# Patient Record
Sex: Female | Born: 1965 | Race: Black or African American | Hispanic: No | Marital: Single
Health system: Southern US, Community
[De-identification: ages and names within clinical notes are randomized; demographics above are authoritative.]

## PROBLEM LIST (undated history)

## (undated) DIAGNOSIS — J189 Pneumonia, unspecified organism: Secondary | ICD-10-CM

## (undated) DIAGNOSIS — G4733 Obstructive sleep apnea (adult) (pediatric): Secondary | ICD-10-CM

## (undated) DIAGNOSIS — I469 Cardiac arrest, cause unspecified: Secondary | ICD-10-CM

## (undated) DIAGNOSIS — I482 Chronic atrial fibrillation, unspecified: Secondary | ICD-10-CM

## (undated) DIAGNOSIS — I5022 Chronic systolic (congestive) heart failure: Secondary | ICD-10-CM

## (undated) DIAGNOSIS — A419 Sepsis, unspecified organism: Secondary | ICD-10-CM

## (undated) DIAGNOSIS — N170 Acute kidney failure with tubular necrosis: Secondary | ICD-10-CM

## (undated) DIAGNOSIS — J9621 Acute and chronic respiratory failure with hypoxia: Secondary | ICD-10-CM

## (undated) DIAGNOSIS — R6520 Severe sepsis without septic shock: Secondary | ICD-10-CM

---

## 2018-07-04 ENCOUNTER — Inpatient Hospital Stay
Admission: AD | Admit: 2018-07-04 | Discharge: 2018-08-01 | Disposition: A | Discharge status: Skilled Nursing Facility | Payer: Medicaid Other | Source: Other Acute Inpatient Hospital | Attending: Internal Medicine | Admitting: Internal Medicine

## 2018-07-04 ENCOUNTER — Other Ambulatory Visit (HOSPITAL_COMMUNITY): Payer: Medicaid Other

## 2018-07-04 DIAGNOSIS — I469 Cardiac arrest, cause unspecified: Secondary | ICD-10-CM | POA: Diagnosis present

## 2018-07-04 DIAGNOSIS — J189 Pneumonia, unspecified organism: Secondary | ICD-10-CM

## 2018-07-04 DIAGNOSIS — J9690 Respiratory failure, unspecified, unspecified whether with hypoxia or hypercapnia: Secondary | ICD-10-CM

## 2018-07-04 DIAGNOSIS — I482 Chronic atrial fibrillation, unspecified: Secondary | ICD-10-CM | POA: Diagnosis present

## 2018-07-04 DIAGNOSIS — N170 Acute kidney failure with tubular necrosis: Secondary | ICD-10-CM | POA: Diagnosis present

## 2018-07-04 DIAGNOSIS — T790XXA Air embolism (traumatic), initial encounter: Secondary | ICD-10-CM

## 2018-07-04 DIAGNOSIS — J811 Chronic pulmonary edema: Secondary | ICD-10-CM

## 2018-07-04 DIAGNOSIS — J9621 Acute and chronic respiratory failure with hypoxia: Secondary | ICD-10-CM | POA: Diagnosis present

## 2018-07-04 DIAGNOSIS — Z9911 Dependence on respirator [ventilator] status: Secondary | ICD-10-CM

## 2018-07-04 DIAGNOSIS — I82409 Acute embolism and thrombosis of unspecified deep veins of unspecified lower extremity: Secondary | ICD-10-CM

## 2018-07-04 DIAGNOSIS — G4733 Obstructive sleep apnea (adult) (pediatric): Secondary | ICD-10-CM | POA: Diagnosis present

## 2018-07-04 HISTORY — DX: Cardiac arrest, cause unspecified: I46.9

## 2018-07-04 HISTORY — DX: Acute kidney failure with tubular necrosis: N17.0

## 2018-07-04 HISTORY — DX: Chronic atrial fibrillation, unspecified: I48.20

## 2018-07-04 HISTORY — DX: Acute and chronic respiratory failure with hypoxia: J96.21

## 2018-07-04 HISTORY — DX: Obstructive sleep apnea (adult) (pediatric): G47.33

## 2018-07-04 LAB — BLOOD GAS, ARTERIAL
Acid-Base Excess: 1.8 mmol/L (ref 0.0–2.0)
Bicarbonate: 26.1 mmol/L (ref 20.0–28.0)
FIO2: 50
MECHVT: 500 mL
O2 Saturation: 96.6 %
PEEP: 5 cmH2O
Patient temperature: 98.6
RATE: 16 resp/min
pCO2 arterial: 42.6 mmHg (ref 32.0–48.0)
pH, Arterial: 7.404 (ref 7.350–7.450)
pO2, Arterial: 88.4 mmHg (ref 83.0–108.0)

## 2018-07-05 ENCOUNTER — Encounter: Payer: Self-pay | Admitting: Internal Medicine

## 2018-07-05 ENCOUNTER — Ambulatory Visit (HOSPITAL_COMMUNITY): Payer: Medicare Other

## 2018-07-05 DIAGNOSIS — N170 Acute kidney failure with tubular necrosis: Secondary | ICD-10-CM | POA: Diagnosis present

## 2018-07-05 DIAGNOSIS — J9621 Acute and chronic respiratory failure with hypoxia: Secondary | ICD-10-CM | POA: Diagnosis present

## 2018-07-05 DIAGNOSIS — I469 Cardiac arrest, cause unspecified: Secondary | ICD-10-CM | POA: Diagnosis present

## 2018-07-05 DIAGNOSIS — I482 Chronic atrial fibrillation, unspecified: Secondary | ICD-10-CM | POA: Diagnosis not present

## 2018-07-05 DIAGNOSIS — G4733 Obstructive sleep apnea (adult) (pediatric): Secondary | ICD-10-CM | POA: Diagnosis present

## 2018-07-05 LAB — CBC WITH DIFFERENTIAL/PLATELET
Abs Immature Granulocytes: 0.8 10*3/uL — ABNORMAL HIGH (ref 0.00–0.07)
Basophils Absolute: 0 10*3/uL (ref 0.0–0.1)
Basophils Relative: 0 %
Eosinophils Absolute: 0 10*3/uL (ref 0.0–0.5)
Eosinophils Relative: 0 %
HCT: 31.3 % — ABNORMAL LOW (ref 36.0–46.0)
Hemoglobin: 9.4 g/dL — ABNORMAL LOW (ref 12.0–15.0)
Lymphocytes Relative: 6 %
Lymphs Abs: 1.2 10*3/uL (ref 0.7–4.0)
MCH: 28.6 pg (ref 26.0–34.0)
MCHC: 30 g/dL (ref 30.0–36.0)
MCV: 95.1 fL (ref 80.0–100.0)
Metamyelocytes Relative: 3 %
Monocytes Absolute: 0.6 10*3/uL (ref 0.1–1.0)
Monocytes Relative: 3 %
Myelocytes: 1 %
Neutro Abs: 18 10*3/uL — ABNORMAL HIGH (ref 1.7–7.7)
Neutrophils Relative %: 87 %
Platelets: 349 10*3/uL (ref 150–400)
RBC: 3.29 MIL/uL — ABNORMAL LOW (ref 3.87–5.11)
RDW: 16.7 % — ABNORMAL HIGH (ref 11.5–15.5)
WBC: 20.7 10*3/uL — ABNORMAL HIGH (ref 4.0–10.5)
nRBC: 1.5 % — ABNORMAL HIGH (ref 0.0–0.2)
nRBC: 2 /100 WBC — ABNORMAL HIGH

## 2018-07-05 LAB — COMPREHENSIVE METABOLIC PANEL
ALT: 34 U/L (ref 0–44)
AST: 24 U/L (ref 15–41)
Albumin: 2.3 g/dL — ABNORMAL LOW (ref 3.5–5.0)
Alkaline Phosphatase: 46 U/L (ref 38–126)
Anion gap: 8 (ref 5–15)
BUN: 85 mg/dL — ABNORMAL HIGH (ref 6–20)
CO2: 24 mmol/L (ref 22–32)
Calcium: 8.4 mg/dL — ABNORMAL LOW (ref 8.9–10.3)
Chloride: 120 mmol/L — ABNORMAL HIGH (ref 98–111)
Creatinine, Ser: 1.55 mg/dL — ABNORMAL HIGH (ref 0.44–1.00)
GFR calc Af Amer: 44 mL/min — ABNORMAL LOW (ref >60)
GFR calc non Af Amer: 38 mL/min — ABNORMAL LOW (ref >60)
Glucose, Bld: 304 mg/dL — ABNORMAL HIGH (ref 70–99)
Potassium: 5.7 mmol/L — ABNORMAL HIGH (ref 3.5–5.1)
Sodium: 152 mmol/L — ABNORMAL HIGH (ref 135–145)
Total Bilirubin: 0.8 mg/dL (ref 0.3–1.2)
Total Protein: 7 g/dL (ref 6.5–8.1)

## 2018-07-05 LAB — URINALYSIS, ROUTINE W REFLEX MICROSCOPIC
Bilirubin Urine: NEGATIVE
Glucose, UA: NEGATIVE mg/dL
Hgb urine dipstick: NEGATIVE
Ketones, ur: NEGATIVE mg/dL
Leukocytes, UA: NEGATIVE
Nitrite: NEGATIVE
Protein, ur: NEGATIVE mg/dL
Specific Gravity, Urine: 1.019 (ref 1.005–1.030)
pH: 5 (ref 5.0–8.0)

## 2018-07-05 LAB — PROTIME-INR
INR: 1.3
Prothrombin Time: 16 seconds — ABNORMAL HIGH (ref 11.4–15.2)

## 2018-07-05 LAB — HEMOGLOBIN A1C
Hgb A1c MFr Bld: 8.7 % — ABNORMAL HIGH (ref 4.8–5.6)
Mean Plasma Glucose: 202.99 mg/dL

## 2018-07-05 LAB — TSH: TSH: 0.382 u[IU]/mL (ref 0.350–4.500)

## 2018-07-05 LAB — PHOSPHORUS: Phosphorus: 5.2 mg/dL — ABNORMAL HIGH (ref 2.5–4.6)

## 2018-07-05 LAB — MAGNESIUM: Magnesium: 2.2 mg/dL (ref 1.7–2.4)

## 2018-07-05 NOTE — Consult Note (Signed)
Pulmonary Critical Care [facility]  Date of Service: 07/05/2018  PULMONARY CRITICAL CARE CONSULT   Cristina Norris  ZOX:[id number]  DOB: 17-Oct-1965   DOA: 07/04/2018  Referring Physician: Carron CurieAli Hijazi, MD  HPI: Cristina Norris is a 53 y.o. female seen for follow up of Acute on Chronic Respiratory Failure.  Patient has multiple medical problems including pneumonia chronic atrial fibrillation V. fib arrest encephalopathy anoxic brain injury acute kidney injury sleep apnea congestive heart failure coronary artery disease.  Patient presented to the hospital after a V. fib arrest.  The patient was shocked with return of circulation and patient subsequently was found to be in atrial fibrillation.  She was intubated at the time mostly for airway protection.  Right now she does open the eyes.  Subsequently patient had complications underwent a tracheostomy on January 15 because of failure to wean off the ventilator.  Patient was evaluated by cardiology for the atrial fibrillation started on amiodarone.  Patient also was found to have pneumonia most which turned out to be Staphylococcus.  Patient was transferred to our facility for further management and weaning.  She was she did not have a cardiac catheterization done at the transferring facility.  Review of Systems:  ROS performed and is unremarkable other than noted above.  Past medical history: Chronic anemia Arthritis CHF Coronary artery disease Depression Sleep apnea Morbid obesity Type 2 diabetes Hypertension Hyperlipidemia Cardiac arrest Atrial fibrillation Acute renal failure Anoxic brain injury  Past surgical history  tracheostomy Arm surgery DNC Knee surgery Tonsillectomy Tubal ligation  Allergies: No known drug allergies  Family history: Diabetes Heart disease Hypertension   Medications: Reviewed on Rounds  Physical Exam:  Vitals: Temperature 98.8 pulse 87  respiratory rate 22 blood pressure 151/87 saturations 92%  Ventilator Settings currently is on assist control FiO2 50% tidal volume 601 PEEP 5  . General: Comfortable at this time . Eyes: Grossly normal lids, irises & conjunctiva . ENT: grossly tongue is normal . Neck: no obvious mass . Cardiovascular: S1-S2 normal no gallop or rub . Respiratory: No rhonchi no rales . Abdomen: Soft nontender . Skin: no rash seen on limited exam . Musculoskeletal: not rigid . Psychiatric:unable to assess . Neurologic: no seizure no involuntary movements         Labs on Admission:  Basic Metabolic Panel: Recent Labs  Lab 07/05/18 0555  NA 152*  K 5.7*  CL 120*  CO2 24  GLUCOSE 304*  BUN 85*  CREATININE 1.55*  CALCIUM 8.4*  MG 2.2  PHOS 5.2*    Recent Labs  Lab 07/04/18 1905  PHART 7.404  PCO2ART 42.6  PO2ART 88.4  HCO3 26.1  O2SAT 96.6    Liver Function Tests: Recent Labs  Lab 07/05/18 0555  AST 24  ALT 34  ALKPHOS 46  BILITOT 0.8  PROT 7.0  ALBUMIN 2.3*   No results for input(s): LIPASE, AMYLASE in the last 168 hours. No results for input(s): AMMONIA in the last 168 hours.  CBC: Recent Labs  Lab 07/05/18 0555  WBC 20.7*  NEUTROABS 18.0*  HGB 9.4*  HCT 31.3*  MCV 95.1  PLT 349    Cardiac Enzymes: No results for input(s): CKTOTAL, CKMB, CKMBINDEX, TROPONINI in the last 168 hours.  BNP (last 3 results) No results for input(s): BNP in the last 8760 hours.  ProBNP (last 3 results) No results for input(s): PROBNP in the last 8760 hours.   Radiological Exams on Admission: Dg  Abd 1 View  Result Date: 07/04/2018 CLINICAL DATA:  Check gastric catheter placement EXAM: ABDOMEN - 1 VIEW COMPARISON:  None. FINDINGS: Gastric catheter is noted within the distal stomach. Scattered large and small bowel gas is noted. No acute abnormality seen. IMPRESSION: Gastric catheter within the distal stomach. Electronically Signed   By: Alcide Clever M.D.   On: 07/04/2018 18:51    Dg Chest Port 1 View  Result Date: 07/04/2018 CLINICAL DATA:  Respiratory failure EXAM: PORTABLE CHEST 1 VIEW COMPARISON:  None. FINDINGS: Tracheostomy tube and gastric catheter are noted in satisfactory position. Heart is mildly enlarged in size but accentuated by the portable technique. Patient rotation to the right is noted. Some accentuation of the mediastinal markings is noted although diffuse central vascular prominence is noted as well as right perihilar and infrahilar infiltrate. No bony abnormality is noted. IMPRESSION: Prominent vasculature which may represent some early CHF. Right hilar and perihilar infiltrative density. Electronically Signed   By: Alcide Clever M.D.   On: 07/04/2018 18:50    Assessment/Plan Active Problems:   Acute on chronic respiratory failure with hypoxia (HCC)   Chronic atrial fibrillation with rapid ventricular response   Acute tubular necrosis (HCC)   Cardiac arrest (HCC)   Obstructive sleep apnea   1. Acute on chronic respiratory failure with hypoxia at this time patient is on full vent support.  Patient was assessed within our SBI having been done today this was not feasible to start the wean.  Patient is still requiring 50% FiO2.  We will try to wean her oxygen down as tolerated and reassess the RSB I daily.  Follow-up x-rays as needed 2. Chronic atrial fibrillation right now she is still in rapid A. fib she has been on amiodarone and also on beta blockers.  We will need to adjust her medications accordingly. 3. Acute renal failure secondary to tubular necrosis BUN 85 creatinine 1.55 at this time.  Will need to monitor closely monitor her fluid status also 4. Cardiac arrest she has a stable atrial fibrillation but currently is in rapid ventricular response need to get this under control. 5. Obstructive sleep apnea nonissue at this time patient is on the ventilator  I have personally seen and evaluated the patient, evaluated laboratory and imaging results,  formulated the assessment and plan and placed orders. The Patient requires high complexity decision making for assessment and support.  Case was discussed on Rounds with the Respiratory Therapy Staff Time Spent  Yevonne Pax, MD [facility] Pulmonary Critical Care Medicine Sleep Medicine

## 2018-07-06 ENCOUNTER — Encounter (HOSPITAL_BASED_OUTPATIENT_CLINIC_OR_DEPARTMENT_OTHER): Payer: Medicaid Other

## 2018-07-06 ENCOUNTER — Encounter (HOSPITAL_COMMUNITY): Payer: Medicaid Other

## 2018-07-06 DIAGNOSIS — M7989 Other specified soft tissue disorders: Secondary | ICD-10-CM

## 2018-07-06 DIAGNOSIS — N170 Acute kidney failure with tubular necrosis: Secondary | ICD-10-CM | POA: Diagnosis not present

## 2018-07-06 DIAGNOSIS — J9621 Acute and chronic respiratory failure with hypoxia: Secondary | ICD-10-CM | POA: Diagnosis not present

## 2018-07-06 DIAGNOSIS — I482 Chronic atrial fibrillation, unspecified: Secondary | ICD-10-CM | POA: Diagnosis not present

## 2018-07-06 DIAGNOSIS — I469 Cardiac arrest, cause unspecified: Secondary | ICD-10-CM | POA: Diagnosis not present

## 2018-07-06 LAB — CBC
HCT: 29.8 % — ABNORMAL LOW (ref 36.0–46.0)
Hemoglobin: 8.7 g/dL — ABNORMAL LOW (ref 12.0–15.0)
MCH: 27.9 pg (ref 26.0–34.0)
MCHC: 29.2 g/dL — ABNORMAL LOW (ref 30.0–36.0)
MCV: 95.5 fL (ref 80.0–100.0)
Platelets: 316 10*3/uL (ref 150–400)
RBC: 3.12 MIL/uL — ABNORMAL LOW (ref 3.87–5.11)
RDW: 16.4 % — ABNORMAL HIGH (ref 11.5–15.5)
WBC: 18.4 10*3/uL — ABNORMAL HIGH (ref 4.0–10.5)
nRBC: 1.4 % — ABNORMAL HIGH (ref 0.0–0.2)

## 2018-07-06 LAB — CULTURE, RESPIRATORY W GRAM STAIN: Culture: NORMAL

## 2018-07-06 LAB — RENAL FUNCTION PANEL
Albumin: 2 g/dL — ABNORMAL LOW (ref 3.5–5.0)
Anion gap: 7 (ref 5–15)
BUN: 75 mg/dL — ABNORMAL HIGH (ref 6–20)
CO2: 25 mmol/L (ref 22–32)
Calcium: 8 mg/dL — ABNORMAL LOW (ref 8.9–10.3)
Chloride: 119 mmol/L — ABNORMAL HIGH (ref 98–111)
Creatinine, Ser: 1.41 mg/dL — ABNORMAL HIGH (ref 0.44–1.00)
GFR calc Af Amer: 49 mL/min — ABNORMAL LOW (ref >60)
GFR calc non Af Amer: 42 mL/min — ABNORMAL LOW (ref >60)
Glucose, Bld: 309 mg/dL — ABNORMAL HIGH (ref 70–99)
Phosphorus: 4.6 mg/dL (ref 2.5–4.6)
Potassium: 4.9 mmol/L (ref 3.5–5.1)
Sodium: 151 mmol/L — ABNORMAL HIGH (ref 135–145)

## 2018-07-06 LAB — URINE CULTURE: Culture: NO GROWTH

## 2018-07-06 LAB — MAGNESIUM: Magnesium: 2.1 mg/dL (ref 1.7–2.4)

## 2018-07-06 LAB — BRAIN NATRIURETIC PEPTIDE: B Natriuretic Peptide: 1109.4 pg/mL — ABNORMAL HIGH (ref 0.0–100.0)

## 2018-07-06 NOTE — Consult Note (Signed)
Ref: Default, Provider, MD   Subjective:  Atrial fibillation with RVR at times.   Objective:  Vital Signs in the last 24 hours: BP: ()/()  Arterial Line BP: ()/()   Physical Exam: BP Readings from Last 1 Encounters:  No data found for BP     Wt Readings from Last 1 Encounters:  No data found for Wt    Weight change:  There is no height or weight on file to calculate BMI. HEENT: [place]/AT, Eyes-Brown, Conjunctiva-Pale, Sclera-Non-icteric.  Neck: No JVD, No bruit, Trachea midline. Tracheostomy tube in place. Lungs:  Clearing, Bilateral. Cardiac:  Irregular rhythm, normal S1 and S2, no S3. II/VI systolic murmur. Abdomen:  Soft, non-tender. BS present. Extremities:  1 + edema present. No cyanosis. No clubbing. CNS: AxOx1, Cranial nerves grossly intact.  Skin: Warm and dry.   Intake/Output from previous day: No intake/output data recorded.    Lab Results: BMET    Component Value Date/Time   NA 151 (H) 07/06/2018 0628   NA 152 (H) 07/05/2018 0555   K 4.9 07/06/2018 0628   K 5.7 (H) 07/05/2018 0555   CL 119 (H) 07/06/2018 0628   CL 120 (H) 07/05/2018 0555   CO2 25 07/06/2018 0628   CO2 24 07/05/2018 0555   GLUCOSE 309 (H) 07/06/2018 0628   GLUCOSE 304 (H) 07/05/2018 0555   BUN 75 (H) 07/06/2018 0628   BUN 85 (H) 07/05/2018 0555   CREATININE 1.41 (H) 07/06/2018 0628   CREATININE 1.55 (H) 07/05/2018 0555   CALCIUM 8.0 (L) 07/06/2018 0628   CALCIUM 8.4 (L) 07/05/2018 0555   GFRNONAA 42 (L) 07/06/2018 0628   GFRNONAA 38 (L) 07/05/2018 0555   GFRAA 49 (L) 07/06/2018 0628   GFRAA 44 (L) 07/05/2018 0555   CBC    Component Value Date/Time   WBC 18.4 (H) 07/06/2018 0628   RBC 3.12 (L) 07/06/2018 0628   HGB 8.7 (L) 07/06/2018 0628   HCT 29.8 (L) 07/06/2018 0628   PLT 316 07/06/2018 0628   MCV 95.5 07/06/2018 0628   MCH 27.9 07/06/2018 0628   MCHC 29.2 (L) 07/06/2018 0628   RDW 16.4 (H) 07/06/2018 0628   LYMPHSABS 1.2 07/05/2018 0555   MONOABS 0.6 07/05/2018 0555   EOSABS 0.0 07/05/2018 0555   BASOSABS 0.0 07/05/2018 0555   HEPATIC Function Panel Recent Labs    07/05/18 0555  PROT 7.0   HEMOGLOBIN A1C No components found for: HGA1C,  MPG CARDIAC ENZYMES No results found for: CKTOTAL, CKMB, CKMBINDEX, TROPONINI BNP No results for input(s): PROBNP in the last 8760 hours. TSH Recent Labs    07/05/18 0555  TSH 0.382   CHOLESTEROL No results for input(s): CHOL in the last 8760 hours.  Scheduled Meds: Continuous Infusions: PRN Meds:.  Assessment/Plan: Atrial fibrillation with RVR LBBB Acute on chronic respiratory failure with hypoxemia Type 2 DM Morbid obesity Pneumonia  Change amlodipine to Diltiazem Add Lanoxin for rate control. Echocardiogram for LVF.   LOS: 0 days    Orpah Cobb  MD  07/06/2018, 8:36 PM

## 2018-07-06 NOTE — Consult Note (Signed)
Late Entry. Referring Physician: Carron Curie, MD  Cristina Norris is an 53 y.o. female.                       Chief Complaint: Atrial fibrillation with RVR  HPI: 53 year old female had V.fib arrest, anoxic brain injury, atrial fibrillation, acute respiratory failure with hypoxia, failure to wean resulting in tracheostomy and had pneumonia with Staphylococcus. She also has CKD, 3, type 2 DM and morbid obesity. She has significant limitation of extremities movement left more than right but responds easily to calling name or tapping shoulder.   Past Medical History:  Diagnosis Date  . Acute on chronic respiratory failure with hypoxia (HCC)   . Acute tubular necrosis (HCC)   . Cardiac arrest (HCC)   . Chronic atrial fibrillation with rapid ventricular response   . Obstructive sleep apnea       The histories are not reviewed yet. Please review them in the "History" navigator section and refresh this SmartLink.  No family history on file. Social History:  has no history on file for tobacco, alcohol, and drug.  Allergies: Allergies not on file  No medications prior to admission.    Results for orders placed or performed during the hospital encounter of 07/04/18 (from the past 48 hour(s))  Urinalysis, Routine w reflex microscopic     Status: None   Collection Time: 07/05/18  4:40 AM  Result Value Ref Range   Color, Urine YELLOW YELLOW   APPearance CLEAR CLEAR   Specific Gravity, Urine 1.019 1.005 - 1.030   pH 5.0 5.0 - 8.0   Glucose, UA NEGATIVE NEGATIVE mg/dL   Hgb urine dipstick NEGATIVE NEGATIVE   Bilirubin Urine NEGATIVE NEGATIVE   Ketones, ur NEGATIVE NEGATIVE mg/dL   Protein, ur NEGATIVE NEGATIVE mg/dL   Nitrite NEGATIVE NEGATIVE   Leukocytes, UA NEGATIVE NEGATIVE    Comment: Performed at [facility], [address] [place], [place] [address]  Culture, Urine     Status: None   Collection Time: 07/05/18  4:49 AM  Result Value Ref Range   Specimen Description URINE,  RANDOM    Special Requests NONE    Culture      NO GROWTH Performed at [facility], [address] [place], [place] [address]    Report Status 07/06/2018 FINAL   Comprehensive metabolic panel     Status: Abnormal   Collection Time: 07/05/18  5:55 AM  Result Value Ref Range   Sodium 152 (H) 135 - 145 mmol/L   Potassium 5.7 (H) 3.5 - 5.1 mmol/L   Chloride 120 (H) 98 - 111 mmol/L   CO2 24 22 - 32 mmol/L   Glucose, Bld 304 (H) 70 - 99 mg/dL   BUN 85 (H) 6 - 20 mg/dL   Creatinine, Ser 0.98 (H) 0.44 - 1.00 mg/dL   Calcium 8.4 (L) 8.9 - 10.3 mg/dL   Total Protein 7.0 6.5 - 8.1 g/dL   Albumin 2.3 (L) 3.5 - 5.0 g/dL   AST 24 15 - 41 U/L   ALT 34 0 - 44 U/L   Alkaline Phosphatase 46 38 - 126 U/L   Total Bilirubin 0.8 0.3 - 1.2 mg/dL   GFR calc non Af Amer 38 (L) >60 mL/min   GFR calc Af Amer 44 (L) >60 mL/min   Anion gap 8 5 - 15    Comment: Performed at [facility], [address], [place],  [place] [id number]  Magnesium     Status: None   Collection Time: 07/05/18  5:55 AM  Result Value Ref Range   Magnesium 2.2 1.7 - 2.4 mg/dL    Comment: Performed at [facility], [address] [place], [place] [address]  Phosphorus     Status: Abnormal   Collection Time: 07/05/18  5:55 AM  Result Value Ref Range   Phosphorus 5.2 (H) 2.5 - 4.6 mg/dL    Comment: Performed at [facility], [address] [place], [place] [address]  Hemoglobin A1c     Status: Abnormal   Collection Time: 07/05/18  5:55 AM  Result Value Ref Range   Hgb A1c MFr Bld 8.7 (H) 4.8 - 5.6 %    Comment: (NOTE) Pre diabetes:          5.7%-6.4% Diabetes:              >6.4% Glycemic control for   <7.0% adults with diabetes    Mean Plasma Glucose 202.99 mg/dL    Comment: Performed at [facility], [address] [place], [place] [address]  TSH     Status: None   Collection Time: 07/05/18  5:55 AM  Result Value Ref Range   TSH 0.382 0.350 - 4.500 uIU/mL    Comment: Performed by a 3rd  Generation assay with a functional sensitivity of <=0.01 uIU/mL. Performed at [facility], [address] [place], [place] [address]   CBC with Differential/Platelet     Status: Abnormal   Collection Time: 07/05/18  5:55 AM  Result Value Ref Range   WBC 20.7 (H) 4.0 - 10.5 K/uL   RBC 3.29 (L) 3.87 - 5.11 MIL/uL   Hemoglobin 9.4 (L) 12.0 - 15.0 g/dL   HCT 84.131.3 (L) 32.436.0 - 40.146.0 %   MCV 95.1 80.0 - 100.0 fL   MCH 28.6 26.0 - 34.0 pg   MCHC 30.0 30.0 - 36.0 g/dL   RDW 02.716.7 (H) 25.311.5 - 66.415.5 %   Platelets 349 150 - 400 K/uL   nRBC 1.5 (H) 0.0 - 0.2 %   Neutrophils Relative % 87 %   Neutro Abs 18.0 (H) 1.7 - 7.7 K/uL   Lymphocytes Relative 6 %   Lymphs Abs 1.2 0.7 - 4.0 K/uL   Monocytes Relative 3 %   Monocytes Absolute 0.6 0.1 - 1.0 K/uL   Eosinophils Relative 0 %   Eosinophils Absolute 0.0 0.0 - 0.5 K/uL   Basophils Relative 0 %   Basophils Absolute 0.0 0.0 - 0.1 K/uL   WBC Morphology See Note     Comment: Mild Left Shift. 1 to 5% Metas and Myelos, Occ Pro Noted.   nRBC 2 (H) 0 /100 WBC   Metamyelocytes Relative 3 %   Myelocytes 1 %   Abs Immature Granulocytes 0.80 (H) 0.00 - 0.07 K/uL   Ovalocytes PRESENT     Comment: Performed at [facility], [address] [place], [place] [address]  Protime-INR     Status: Abnormal   Collection Time: 07/05/18  5:55 AM  Result Value Ref Range   Prothrombin Time 16.0 (H) 11.4 - 15.2 seconds   INR 1.30     Comment: Performed at [facility]  Lab, [address] [place], [place] [address]  CBC     Status: Abnormal   Collection Time: 07/06/18  6:28 AM  Result Value Ref Range   WBC 18.4 (H) 4.0 - 10.5 K/uL  RBC 3.12 (L) 3.87 - 5.11 MIL/uL   Hemoglobin 8.7 (L) 12.0 - 15.0 g/dL   HCT 91.429.8 (L) 78.236.0 - 95.646.0 %   MCV 95.5 80.0 - 100.0 fL   MCH 27.9 26.0 - 34.0 pg   MCHC 29.2 (L) 30.0 - 36.0 g/dL   RDW 21.316.4 (H) 08.611.5 - 57.815.5 %   Platelets 316 150 - 400 K/uL   nRBC 1.4 (H) 0.0 - 0.2 %    Comment: Performed at [facility]  [facility], [address] [place], [place] [address]  Renal function panel     Status: Abnormal   Collection Time: 07/06/18  6:28 AM  Result Value Ref Range   Sodium 151 (H) 135 - 145 mmol/L   Potassium 4.9 3.5 - 5.1 mmol/L   Chloride 119 (H) 98 - 111 mmol/L   CO2 25 22 - 32 mmol/L   Glucose, Bld 309 (H) 70 - 99 mg/dL   BUN 75 (H) 6 - 20 mg/dL   Creatinine, Ser 9.521.41 (H) 0.44 - 1.00 mg/dL   Calcium 8.0 (L) 8.9 - 10.3 mg/dL   Phosphorus 4.6 2.5 - 4.6 mg/dL   Albumin 2.0 (L) 3.5 - 5.0 g/dL   GFR calc non Af Amer 42 (L) >60 mL/min   GFR calc Af Amer 49 (L) >60 mL/min   Anion gap 7 5 - 15    Comment: Performed at [facility], [address] [place], [place] [address]  Magnesium     Status: None   Collection Time: 07/06/18  6:28 AM  Result Value Ref Range   Magnesium 2.1 1.7 - 2.4 mg/dL    Comment: Performed at [facility], [address] [place], [place] [address]  Brain natriuretic peptide     Status: Abnormal   Collection Time: 07/06/18  6:28 AM  Result Value Ref Range   B Natriuretic Peptide 1,109.4 (H) 0.0 - 100.0 pg/mL    Comment: Performed at [facility], [address] [place], [place] [address]   Vas [place] Lower Extremity Venous (dvt)  Result Date: 07/06/2018  Lower Venous Study Indications: Pain.  Limitations: Poor ultrasound/tissue interface, body habitus and patient immobility, patient positioning. Performing Technologist: Chanda BusingGregory Collins RVT  Examination Guidelines: A complete evaluation includes B-mode imaging, spectral Doppler, color Doppler, and power Doppler as needed of all accessible portions of each vessel. Bilateral testing is considered an integral part of a complete examination. Limited examinations for reoccurring indications may be performed as noted.  Right Venous Findings: +---+---------------+---------+-----------+----------+-------+    CompressibilityPhasicitySpontaneityPropertiesSummary  +---+---------------+---------+-----------+----------+-------+ CFVFull           Yes      Yes                          +---+---------------+---------+-----------+----------+-------+  Left Venous Findings: +---------+---------------+---------+-----------+----------+--------------+          CompressibilityPhasicitySpontaneityPropertiesSummary        +---------+---------------+---------+-----------+----------+--------------+ CFV      Full           Yes      Yes                                 +---------+---------------+---------+-----------+----------+--------------+ SFJ      Full                                                        +---------+---------------+---------+-----------+----------+--------------+  FV Prox  Full                                                        +---------+---------------+---------+-----------+----------+--------------+ FV Mid   Full                                                        +---------+---------------+---------+-----------+----------+--------------+ FV DistalFull                                                        +---------+---------------+---------+-----------+----------+--------------+ PFV      Full                                                        +---------+---------------+---------+-----------+----------+--------------+ POP                     Yes      Yes                                 +---------+---------------+---------+-----------+----------+--------------+ PTV      Full                                                        +---------+---------------+---------+-----------+----------+--------------+ PERO                                                  Not visualized +---------+---------------+---------+-----------+----------+--------------+    Summary: Right: No evidence of common femoral vein obstruction. Left: There is no evidence of deep vein thrombosis in the lower extremity.  However, portions of this examination were limited- see technologist comments above. No cystic structure found in the popliteal fossa.  *See table(s) above for measurements and observations. Electronically signed by Gretta Began MD on 07/06/2018 at 2:30:39 PM.    Final    Vas [place] Upper Extremity Venous Duplex  Result Date: 07/06/2018 UPPER VENOUS STUDY  Indications: Swelling Limitations: Body habitus, poor ultrasound/tissue interface and patient immobility, patient positioning. Performing Technologist: Chanda Busing RVT  Examination Guidelines: A complete evaluation includes B-mode imaging, spectral Doppler, color Doppler, and power Doppler as needed of all accessible portions of each vessel. Bilateral testing is considered an integral part of a complete examination. Limited examinations for reoccurring indications may be performed as noted.  Right Findings: +----------+------------+----------+---------+-----------+-------+ RIGHT     CompressiblePropertiesPhasicitySpontaneousSummary +----------+------------+----------+---------+-----------+-------+ Subclavian    Full  Yes       Yes            +----------+------------+----------+---------+-----------+-------+  Left Findings: +----------+------------+----------+---------+-----------+-------+ LEFT      CompressiblePropertiesPhasicitySpontaneousSummary +----------+------------+----------+---------+-----------+-------+ IJV           Full                 Yes       Yes            +----------+------------+----------+---------+-----------+-------+ Subclavian    Full                 Yes       Yes            +----------+------------+----------+---------+-----------+-------+ Axillary      Full                 Yes       Yes            +----------+------------+----------+---------+-----------+-------+ Brachial      Full                 Yes       Yes             +----------+------------+----------+---------+-----------+-------+ Radial        Full                                          +----------+------------+----------+---------+-----------+-------+ Ulnar         Full                                          +----------+------------+----------+---------+-----------+-------+ Cephalic      Full                                          +----------+------------+----------+---------+-----------+-------+ Basilic       Full                                          +----------+------------+----------+---------+-----------+-------+  Summary:  Right: No evidence of thrombosis in the subclavian.  Left: No evidence of deep vein thrombosis in the upper extremity. No evidence of superficial vein thrombosis in the upper extremity.  *See table(s) above for measurements and observations.  Diagnosing physician: Gretta Began MD Electronically signed by Gretta Began MD on 07/06/2018 at 2:30:51 PM.    Final     Review Of Systems As in history.    There were no vitals taken for this visit. There is no height or weight on file to calculate BMI. General appearance: alert, cooperative, appears stated age and no distress Head: Normocephalic, atraumatic. Eyes: Brown eyes, pale conjunctiva, corneas clear.  Neck: No adenopathy, no carotid bruit, no JVD, supple, symmetrical, trachea midline and thyroid not enlarged. Resp: Clearing to auscultation bilaterally. Cardio: Irregular rate and tachycardic. S1, S2 normal, II/VI systolic murmur, no click, rub or gallop GI: Soft, non-tender; bowel sounds normal; no organomegaly. Extremities: 1 + edema, no cyanosis or clubbing. Skin: Warm and dry.  Neurologic: Alert and oriented X 1, decreased strength.   Assessment/Plan Atrial fibrillation with RVR, [id number] score  of 3 Left bundle branch block Acute on chronic respiratory failure with hypoxia Anemia of chronic disease Type 2 DM Morbid  obesity Pneumonia  Agree with amiodarone and metoprolol use. Check LV function. Control blood glucose to decrease tachycardia Respiratory treatment and infection control per primary and Pulmonary specialist.  Ricki Rodriguez, MD  07/06/2018, 8:19 PM

## 2018-07-06 NOTE — Progress Notes (Signed)
Left upper extremity and lower extremity venous duplex has been completed. Preliminary results can be found in CV Proc through chart review.   07/06/18 11:47 AM Olen Cordial RVT

## 2018-07-06 NOTE — Progress Notes (Signed)
Pulmonary Critical Care Medicine [facility] [place]   PULMONARY CRITICAL CARE SERVICE  PROGRESS NOTE  Date of Service: 07/06/2018  Cristina Norris  YYT:[id number]  DOB: 1965/10/22   DOA: 07/04/2018  Referring Physician: Carron Curie, MD  HPI: Cristina Norris is a 53 y.o. female seen for follow up of Acute on Chronic Respiratory Failure.  Currently patient is on a wean pressure support on 40% FiO2 she is actually doing better today  Medications: Reviewed on Rounds  Physical Exam:  Vitals: Temperature 99.4 pulse 97 respiratory 13 blood pressure 113/75 saturations 100%  Ventilator Settings mode ventilation pressure support FiO2 40% tidal volume 612 per support 12 PEEP 5  . General: Comfortable at this time . Eyes: Grossly normal lids, irises & conjunctiva . ENT: grossly tongue is normal . Neck: no obvious mass . Cardiovascular: S1 S2 normal no gallop . Respiratory: No rhonchi or rales are noted at this time . Abdomen: soft . Skin: no rash seen on limited exam . Musculoskeletal: not rigid . Psychiatric:unable to assess . Neurologic: no seizure no involuntary movements         Lab Data:   Basic Metabolic Panel: Recent Labs  Lab 07/05/18 0555 07/06/18 0628  NA 152* 151*  K 5.7* 4.9  CL 120* 119*  CO2 24 25  GLUCOSE 304* 309*  BUN 85* 75*  CREATININE 1.55* 1.41*  CALCIUM 8.4* 8.0*  MG 2.2 2.1  PHOS 5.2* 4.6    ABG: Recent Labs  Lab 07/04/18 1905  PHART 7.404  PCO2ART 42.6  PO2ART 88.4  HCO3 26.1  O2SAT 96.6    Liver Function Tests: Recent Labs  Lab 07/05/18 0555 07/06/18 0628  AST 24  --   ALT 34  --   ALKPHOS 46  --   BILITOT 0.8  --   PROT 7.0  --   ALBUMIN 2.3* 2.0*   No results for input(s): LIPASE, AMYLASE in the last 168 hours. No results for input(s): AMMONIA in the last 168 hours.  CBC: Recent Labs  Lab 07/05/18 0555 07/06/18 0628  WBC 20.7* 18.4*  NEUTROABS 18.0*  --   HGB 9.4* 8.7*  HCT 31.3* 29.8*  MCV 95.1 95.5   PLT 349 316    Cardiac Enzymes: No results for input(s): CKTOTAL, CKMB, CKMBINDEX, TROPONINI in the last 168 hours.  BNP (last 3 results) Recent Labs    07/06/18 0628  BNP 1,109.4*    ProBNP (last 3 results) No results for input(s): PROBNP in the last 8760 hours.  Radiological Exams: Dg Abd 1 View  Result Date: 07/04/2018 CLINICAL DATA:  Check gastric catheter placement EXAM: ABDOMEN - 1 VIEW COMPARISON:  None. FINDINGS: Gastric catheter is noted within the distal stomach. Scattered large and small bowel gas is noted. No acute abnormality seen. IMPRESSION: Gastric catheter within the distal stomach. Electronically Signed   By: Alcide Clever M.D.   On: 07/04/2018 18:51   Dg Chest Port 1 View  Result Date: 07/04/2018 CLINICAL DATA:  Respiratory failure EXAM: PORTABLE CHEST 1 VIEW COMPARISON:  None. FINDINGS: Tracheostomy tube and gastric catheter are noted in satisfactory position. Heart is mildly enlarged in size but accentuated by the portable technique. Patient rotation to the right is noted. Some accentuation of the mediastinal markings is noted although diffuse central vascular prominence is noted as well as right perihilar and infrahilar infiltrate. No bony abnormality is noted. IMPRESSION: Prominent vasculature which may represent some early CHF. Right hilar and perihilar infiltrative density. Electronically Signed  By: Alcide CleverMark  Lukens M.D.   On: 07/04/2018 18:50   Vas [place] Lower Extremity Venous (dvt)  Result Date: 07/06/2018  Lower Venous Study Indications: Pain.  Limitations: Poor ultrasound/tissue interface, body habitus and patient immobility, patient positioning. Performing Technologist: Chanda BusingGregory Collins RVT  Examination Guidelines: A complete evaluation includes B-mode imaging, spectral Doppler, color Doppler, and power Doppler as needed of all accessible portions of each vessel. Bilateral testing is considered an integral part of a complete examination. Limited examinations for  reoccurring indications may be performed as noted.  Right Venous Findings: +---+---------------+---------+-----------+----------+-------+    CompressibilityPhasicitySpontaneityPropertiesSummary +---+---------------+---------+-----------+----------+-------+ CFVFull           Yes      Yes                          +---+---------------+---------+-----------+----------+-------+  Left Venous Findings: +---------+---------------+---------+-----------+----------+--------------+          CompressibilityPhasicitySpontaneityPropertiesSummary        +---------+---------------+---------+-----------+----------+--------------+ CFV      Full           Yes      Yes                                 +---------+---------------+---------+-----------+----------+--------------+ SFJ      Full                                                        +---------+---------------+---------+-----------+----------+--------------+ FV Prox  Full                                                        +---------+---------------+---------+-----------+----------+--------------+ FV Mid   Full                                                        +---------+---------------+---------+-----------+----------+--------------+ FV DistalFull                                                        +---------+---------------+---------+-----------+----------+--------------+ PFV      Full                                                        +---------+---------------+---------+-----------+----------+--------------+ POP                     Yes      Yes                                 +---------+---------------+---------+-----------+----------+--------------+ PTV      Full                                                        +---------+---------------+---------+-----------+----------+--------------+  PERO                                                  Not visualized  +---------+---------------+---------+-----------+----------+--------------+    Summary: Right: No evidence of common femoral vein obstruction. Left: There is no evidence of deep vein thrombosis in the lower extremity. However, portions of this examination were limited- see technologist comments above. No cystic structure found in the popliteal fossa.  *See table(s) above for measurements and observations.    Preliminary    Vas [place] Upper Extremity Venous Duplex  Result Date: 07/06/2018 UPPER VENOUS STUDY  Indications: Swelling Limitations: Body habitus, poor ultrasound/tissue interface and patient immobility, patient positioning. Performing Technologist: Chanda BusingGregory Collins RVT  Examination Guidelines: A complete evaluation includes B-mode imaging, spectral Doppler, color Doppler, and power Doppler as needed of all accessible portions of each vessel. Bilateral testing is considered an integral part of a complete examination. Limited examinations for reoccurring indications may be performed as noted.  Right Findings: +----------+------------+----------+---------+-----------+-------+ RIGHT     CompressiblePropertiesPhasicitySpontaneousSummary +----------+------------+----------+---------+-----------+-------+ Subclavian    Full                 Yes       Yes            +----------+------------+----------+---------+-----------+-------+  Left Findings: +----------+------------+----------+---------+-----------+-------+ LEFT      CompressiblePropertiesPhasicitySpontaneousSummary +----------+------------+----------+---------+-----------+-------+ IJV           Full                 Yes       Yes            +----------+------------+----------+---------+-----------+-------+ Subclavian    Full                 Yes       Yes            +----------+------------+----------+---------+-----------+-------+ Axillary      Full                 Yes       Yes             +----------+------------+----------+---------+-----------+-------+ Brachial      Full                 Yes       Yes            +----------+------------+----------+---------+-----------+-------+ Radial        Full                                          +----------+------------+----------+---------+-----------+-------+ Ulnar         Full                                          +----------+------------+----------+---------+-----------+-------+ Cephalic      Full                                          +----------+------------+----------+---------+-----------+-------+ Basilic       Full                                          +----------+------------+----------+---------+-----------+-------+  Summary:  Right: No evidence of thrombosis in the subclavian.  Left: No evidence of deep vein thrombosis in the upper extremity. No evidence of superficial vein thrombosis in the upper extremity.  *See table(s) above for measurements and observations.    Preliminary     Assessment/Plan Active Problems:   Acute on chronic respiratory failure with hypoxia (HCC)   Chronic atrial fibrillation with rapid ventricular response   Acute tubular necrosis (HCC)   Cardiac arrest (HCC)   Obstructive sleep apnea   1. Acute on chronic respiratory failure with hypoxia we will continue with pressure support wean titrate oxygen as tolerated continue pulmonary toilet. 2. Chronic atrial fibrillation rate is controlled 3. ATN clinically improving 4. Cardiac arrest rhythm is stable 5. Obstructive sleep apnea nonissue with trach in place   I have personally seen and evaluated the patient, evaluated laboratory and imaging results, formulated the assessment and plan and placed orders. The Patient requires high complexity decision making for assessment and support.  Case was discussed on Rounds with the Respiratory Therapy Staff  Yevonne Pax, MD [facility] Pulmonary Critical Care Medicine Sleep  Medicine

## 2018-07-07 ENCOUNTER — Other Ambulatory Visit (HOSPITAL_COMMUNITY): Payer: Medicaid Other

## 2018-07-07 DIAGNOSIS — I469 Cardiac arrest, cause unspecified: Secondary | ICD-10-CM | POA: Diagnosis not present

## 2018-07-07 DIAGNOSIS — J9621 Acute and chronic respiratory failure with hypoxia: Secondary | ICD-10-CM | POA: Diagnosis not present

## 2018-07-07 DIAGNOSIS — N170 Acute kidney failure with tubular necrosis: Secondary | ICD-10-CM | POA: Diagnosis not present

## 2018-07-07 DIAGNOSIS — I482 Chronic atrial fibrillation, unspecified: Secondary | ICD-10-CM | POA: Diagnosis not present

## 2018-07-07 LAB — CBC
HCT: 30 % — ABNORMAL LOW (ref 36.0–46.0)
Hemoglobin: 9.2 g/dL — ABNORMAL LOW (ref 12.0–15.0)
MCH: 28.7 pg (ref 26.0–34.0)
MCHC: 30.7 g/dL (ref 30.0–36.0)
MCV: 93.5 fL (ref 80.0–100.0)
Platelets: 334 10*3/uL (ref 150–400)
RBC: 3.21 MIL/uL — ABNORMAL LOW (ref 3.87–5.11)
RDW: 16.1 % — ABNORMAL HIGH (ref 11.5–15.5)
WBC: 22.6 10*3/uL — ABNORMAL HIGH (ref 4.0–10.5)
nRBC: 1 % — ABNORMAL HIGH (ref 0.0–0.2)

## 2018-07-07 LAB — RENAL FUNCTION PANEL
Albumin: 2 g/dL — ABNORMAL LOW (ref 3.5–5.0)
Anion gap: 5 (ref 5–15)
BUN: 62 mg/dL — ABNORMAL HIGH (ref 6–20)
CO2: 26 mmol/L (ref 22–32)
Calcium: 8.1 mg/dL — ABNORMAL LOW (ref 8.9–10.3)
Chloride: 119 mmol/L — ABNORMAL HIGH (ref 98–111)
Creatinine, Ser: 1.3 mg/dL — ABNORMAL HIGH (ref 0.44–1.00)
GFR calc Af Amer: 54 mL/min — ABNORMAL LOW (ref >60)
GFR calc non Af Amer: 46 mL/min — ABNORMAL LOW (ref >60)
Glucose, Bld: 203 mg/dL — ABNORMAL HIGH (ref 70–99)
Phosphorus: 3.8 mg/dL (ref 2.5–4.6)
Potassium: 4.7 mmol/L (ref 3.5–5.1)
Sodium: 150 mmol/L — ABNORMAL HIGH (ref 135–145)

## 2018-07-07 LAB — MAGNESIUM: Magnesium: 2.1 mg/dL (ref 1.7–2.4)

## 2018-07-07 NOTE — Progress Notes (Addendum)
Pulmonary Critical Care Medicine [facility] [place]   PULMONARY CRITICAL CARE SERVICE  PROGRESS NOTE  Date of Service: 07/07/2018  Cristina Norris  PPI:[id number]  DOB: 1966-04-14   DOA: 07/04/2018  Referring Physician: Carron Curie, MD  HPI: Cristina Norris is a 53 y.o. female seen for follow up of Acute on Chronic Respiratory Failure.  Patient continues to tolerate pressure support very well.  Her FiO2 was decreased to 35%.  Medications: Reviewed on Rounds  Physical Exam:  Vitals: Pulse 89 respirations 22 blood pressure 140/84 O2 sat 96% temp 99.6  Ventilator Settings ventilator mode pressure support rate 12 PEEP of 5 FiO2 35%  . General: Comfortable at this time . Eyes: Grossly normal lids, irises & conjunctiva . ENT: grossly tongue is normal . Neck: no obvious mass . Cardiovascular: S1 S2 normal no gallop . Respiratory: No rhonchi rales noted . Abdomen: soft . Skin: no rash seen on limited exam . Musculoskeletal: not rigid . Psychiatric:unable to assess . Neurologic: no seizure no involuntary movements         Lab Data:   Basic Metabolic Panel: Recent Labs  Lab 07/05/18 0555 07/06/18 0628 07/07/18 0545  NA 152* 151* 150*  K 5.7* 4.9 4.7  CL 120* 119* 119*  CO2 24 25 26   GLUCOSE 304* 309* 203*  BUN 85* 75* 62*  CREATININE 1.55* 1.41* 1.30*  CALCIUM 8.4* 8.0* 8.1*  MG 2.2 2.1 2.1  PHOS 5.2* 4.6 3.8    ABG: Recent Labs  Lab 07/04/18 1905  PHART 7.404  PCO2ART 42.6  PO2ART 88.4  HCO3 26.1  O2SAT 96.6    Liver Function Tests: Recent Labs  Lab 07/05/18 0555 07/06/18 0628 07/07/18 0545  AST 24  --   --   ALT 34  --   --   ALKPHOS 46  --   --   BILITOT 0.8  --   --   PROT 7.0  --   --   ALBUMIN 2.3* 2.0* 2.0*   No results for input(s): LIPASE, AMYLASE in the last 168 hours. No results for input(s): AMMONIA in the last 168 hours.  CBC: Recent Labs  Lab 07/05/18 0555 07/06/18 0628 07/07/18 0545  WBC 20.7* 18.4* 22.6*   NEUTROABS 18.0*  --   --   HGB 9.4* 8.7* 9.2*  HCT 31.3* 29.8* 30.0*  MCV 95.1 95.5 93.5  PLT 349 316 334    Cardiac Enzymes: No results for input(s): CKTOTAL, CKMB, CKMBINDEX, TROPONINI in the last 168 hours.  BNP (last 3 results) Recent Labs    07/06/18 0628  BNP 1,109.4*    ProBNP (last 3 results) No results for input(s): PROBNP in the last 8760 hours.  Radiological Exams: Dg Chest Port 1 View  Result Date: 07/07/2018 CLINICAL DATA:  Ventilator dependent EXAM: PORTABLE CHEST 1 VIEW COMPARISON:  07/04/2018 FINDINGS: Cardiac shadow remains enlarged. Tracheostomy tube and gastric catheter are again noted and stable. Patient rotation to the right is noted accentuating the mediastinal markings. Interval improvement of previously seen vascular congestion is noted. Mild right basilar atelectatic changes are seen. No pneumothorax is noted. IMPRESSION: Stable basilar atelectasis on the right. Improved vascular congestion. Electronically Signed   By: Alcide Clever M.D.   On: 07/07/2018 08:37   Vas [place] Lower Extremity Venous (dvt)  Result Date: 07/06/2018  Lower Venous Study Indications: Pain.  Limitations: Poor ultrasound/tissue interface, body habitus and patient immobility, patient positioning. Performing Technologist: Chanda Busing RVT  Examination Guidelines: A complete evaluation  includes B-mode imaging, spectral Doppler, color Doppler, and power Doppler as needed of all accessible portions of each vessel. Bilateral testing is considered an integral part of a complete examination. Limited examinations for reoccurring indications may be performed as noted.  Right Venous Findings: +---+---------------+---------+-----------+----------+-------+    CompressibilityPhasicitySpontaneityPropertiesSummary +---+---------------+---------+-----------+----------+-------+ CFVFull           Yes      Yes                          +---+---------------+---------+-----------+----------+-------+   Left Venous Findings: +---------+---------------+---------+-----------+----------+--------------+          CompressibilityPhasicitySpontaneityPropertiesSummary        +---------+---------------+---------+-----------+----------+--------------+ CFV      Full           Yes      Yes                                 +---------+---------------+---------+-----------+----------+--------------+ SFJ      Full                                                        +---------+---------------+---------+-----------+----------+--------------+ FV Prox  Full                                                        +---------+---------------+---------+-----------+----------+--------------+ FV Mid   Full                                                        +---------+---------------+---------+-----------+----------+--------------+ FV DistalFull                                                        +---------+---------------+---------+-----------+----------+--------------+ PFV      Full                                                        +---------+---------------+---------+-----------+----------+--------------+ POP                     Yes      Yes                                 +---------+---------------+---------+-----------+----------+--------------+ PTV      Full                                                        +---------+---------------+---------+-----------+----------+--------------+  PERO                                                  Not visualized +---------+---------------+---------+-----------+----------+--------------+    Summary: Right: No evidence of common femoral vein obstruction. Left: There is no evidence of deep vein thrombosis in the lower extremity. However, portions of this examination were limited- see technologist comments above. No cystic structure found in the popliteal fossa.  *See table(s) above for measurements and  observations. Electronically signed by Gretta Beganodd Early MD on 07/06/2018 at 2:30:39 PM.    Final    Vas [place] Upper Extremity Venous Duplex  Result Date: 07/06/2018 UPPER VENOUS STUDY  Indications: Swelling Limitations: Body habitus, poor ultrasound/tissue interface and patient immobility, patient positioning. Performing Technologist: Chanda BusingGregory Collins RVT  Examination Guidelines: A complete evaluation includes B-mode imaging, spectral Doppler, color Doppler, and power Doppler as needed of all accessible portions of each vessel. Bilateral testing is considered an integral part of a complete examination. Limited examinations for reoccurring indications may be performed as noted.  Right Findings: +----------+------------+----------+---------+-----------+-------+ RIGHT     CompressiblePropertiesPhasicitySpontaneousSummary +----------+------------+----------+---------+-----------+-------+ Subclavian    Full                 Yes       Yes            +----------+------------+----------+---------+-----------+-------+  Left Findings: +----------+------------+----------+---------+-----------+-------+ LEFT      CompressiblePropertiesPhasicitySpontaneousSummary +----------+------------+----------+---------+-----------+-------+ IJV           Full                 Yes       Yes            +----------+------------+----------+---------+-----------+-------+ Subclavian    Full                 Yes       Yes            +----------+------------+----------+---------+-----------+-------+ Axillary      Full                 Yes       Yes            +----------+------------+----------+---------+-----------+-------+ Brachial      Full                 Yes       Yes            +----------+------------+----------+---------+-----------+-------+ Radial        Full                                          +----------+------------+----------+---------+-----------+-------+ Ulnar         Full                                           +----------+------------+----------+---------+-----------+-------+ Cephalic      Full                                          +----------+------------+----------+---------+-----------+-------+ Basilic  Full                                          +----------+------------+----------+---------+-----------+-------+  Summary:  Right: No evidence of thrombosis in the subclavian.  Left: No evidence of deep vein thrombosis in the upper extremity. No evidence of superficial vein thrombosis in the upper extremity.  *See table(s) above for measurements and observations.  Diagnosing physician: Gretta Began MD Electronically signed by Gretta Began MD on 07/06/2018 at 2:30:51 PM.    Final     Assessment/Plan Active Problems:   Acute on chronic respiratory failure with hypoxia (HCC)   Chronic atrial fibrillation with rapid ventricular response   Acute tubular necrosis (HCC)   Cardiac arrest (HCC)   Obstructive sleep apnea   1. Acute on chronic respiratory failure with hypoxia continue to wean pressure support and oxygen as tolerated.  Continue aggressive pulmonary toilet. 2. Chronic atrial fibrillation rate controlled 3. ATN clinically improving 4. Cardiac arrest rhythm stable 5. Obstructive sleep apnea nonissue trach in place   I have personally seen and evaluated the patient, evaluated laboratory and imaging results, formulated the assessment and plan and placed orders. The Patient requires high complexity decision making for assessment and support.  Case was discussed on Rounds with the Respiratory Therapy Staff  Yevonne Pax, MD [facility] Pulmonary Critical Care Medicine Sleep Medicine

## 2018-07-07 NOTE — Progress Notes (Signed)
  Echocardiogram 2D Echocardiogram has been performed.  Cristina Norris Cristina Norris 07/07/2018, 10:21 AM

## 2018-07-08 DIAGNOSIS — J9621 Acute and chronic respiratory failure with hypoxia: Secondary | ICD-10-CM | POA: Diagnosis not present

## 2018-07-08 DIAGNOSIS — N170 Acute kidney failure with tubular necrosis: Secondary | ICD-10-CM | POA: Diagnosis not present

## 2018-07-08 DIAGNOSIS — I482 Chronic atrial fibrillation, unspecified: Secondary | ICD-10-CM | POA: Diagnosis not present

## 2018-07-08 DIAGNOSIS — I469 Cardiac arrest, cause unspecified: Secondary | ICD-10-CM | POA: Diagnosis not present

## 2018-07-08 LAB — BASIC METABOLIC PANEL
Anion gap: 5 (ref 5–15)
BUN: 54 mg/dL — ABNORMAL HIGH (ref 6–20)
CO2: 25 mmol/L (ref 22–32)
Calcium: 8.2 mg/dL — ABNORMAL LOW (ref 8.9–10.3)
Chloride: 115 mmol/L — ABNORMAL HIGH (ref 98–111)
Creatinine, Ser: 1.38 mg/dL — ABNORMAL HIGH (ref 0.44–1.00)
GFR calc Af Amer: 50 mL/min — ABNORMAL LOW (ref >60)
GFR calc non Af Amer: 43 mL/min — ABNORMAL LOW (ref >60)
Glucose, Bld: 152 mg/dL — ABNORMAL HIGH (ref 70–99)
Potassium: 4.6 mmol/L (ref 3.5–5.1)
Sodium: 145 mmol/L (ref 135–145)

## 2018-07-08 NOTE — Progress Notes (Signed)
Pulmonary Critical Care Medicine [facility] [place]   PULMONARY CRITICAL CARE SERVICE  PROGRESS NOTE  Date of Service: 07/08/2018  Cristina Norris  NID:[id number]  DOB: 1966/05/13   DOA: 07/04/2018  Referring Physician: Carron Curie, MD  HPI: Cristina Norris is a 53 y.o. female seen for follow up of Acute on Chronic Respiratory Failure.  Patient is on pressure support at this time patient is weaning on 35% FiO2  Medications: Reviewed on Rounds  Physical Exam:  Vitals: Temperature 98.2 pulse 78 respiratory rate 20 blood pressure 102/75 saturation 99%  Ventilator Settings mode ventilation pressure support FiO2 35% tidal volume 621 pressure support 12 PEEP 5  . General: Comfortable at this time . Eyes: Grossly normal lids, irises & conjunctiva . ENT: grossly tongue is normal . Neck: no obvious mass . Cardiovascular: S1 S2 normal no gallop . Respiratory: Scattered rhonchi expansion is equal . Abdomen: soft . Skin: no rash seen on limited exam . Musculoskeletal: not rigid . Psychiatric:unable to assess . Neurologic: no seizure no involuntary movements         Lab Data:   Basic Metabolic Panel: Recent Labs  Lab 07/05/18 0555 07/06/18 0628 07/07/18 0545 07/08/18 0616  NA 152* 151* 150* 145  K 5.7* 4.9 4.7 4.6  CL 120* 119* 119* 115*  CO2 24 25 26 25   GLUCOSE 304* 309* 203* 152*  BUN 85* 75* 62* 54*  CREATININE 1.55* 1.41* 1.30* 1.38*  CALCIUM 8.4* 8.0* 8.1* 8.2*  MG 2.2 2.1 2.1  --   PHOS 5.2* 4.6 3.8  --     ABG: Recent Labs  Lab 07/04/18 1905  PHART 7.404  PCO2ART 42.6  PO2ART 88.4  HCO3 26.1  O2SAT 96.6    Liver Function Tests: Recent Labs  Lab 07/05/18 0555 07/06/18 0628 07/07/18 0545  AST 24  --   --   ALT 34  --   --   ALKPHOS 46  --   --   BILITOT 0.8  --   --   PROT 7.0  --   --   ALBUMIN 2.3* 2.0* 2.0*   No results for input(s): LIPASE, AMYLASE in the last 168 hours. No results for input(s): AMMONIA in the last 168  hours.  CBC: Recent Labs  Lab 07/05/18 0555 07/06/18 0628 07/07/18 0545  WBC 20.7* 18.4* 22.6*  NEUTROABS 18.0*  --   --   HGB 9.4* 8.7* 9.2*  HCT 31.3* 29.8* 30.0*  MCV 95.1 95.5 93.5  PLT 349 316 334    Cardiac Enzymes: No results for input(s): CKTOTAL, CKMB, CKMBINDEX, TROPONINI in the last 168 hours.  BNP (last 3 results) Recent Labs    07/06/18 0628  BNP 1,109.4*    ProBNP (last 3 results) No results for input(s): PROBNP in the last 8760 hours.  Radiological Exams: Dg Chest Port 1 View  Result Date: 07/07/2018 CLINICAL DATA:  Ventilator dependent EXAM: PORTABLE CHEST 1 VIEW COMPARISON:  07/04/2018 FINDINGS: Cardiac shadow remains enlarged. Tracheostomy tube and gastric catheter are again noted and stable. Patient rotation to the right is noted accentuating the mediastinal markings. Interval improvement of previously seen vascular congestion is noted. Mild right basilar atelectatic changes are seen. No pneumothorax is noted. IMPRESSION: Stable basilar atelectasis on the right. Improved vascular congestion. Electronically Signed   By: Alcide Clever M.D.   On: 07/07/2018 08:37    Assessment/Plan Active Problems:   Acute on chronic respiratory failure with hypoxia (HCC)   Chronic atrial fibrillation with  rapid ventricular response   Acute tubular necrosis (HCC)   Cardiac arrest (HCC)   Obstructive sleep apnea   1. Acute on chronic respiratory failure with hypoxia we will continue with the pressure support wean titrate oxygen as tolerated continue pulmonary toilet. 2. Chronic atrial fibrillation rate controlled 3. Acute tubular necrosis at baseline follow labs 4. Cardiac arrest rhythm stable 5. OSA nonissue at this time   I have personally seen and evaluated the patient, evaluated laboratory and imaging results, formulated the assessment and plan and placed orders. The Patient requires high complexity decision making for assessment and support.  Case was discussed  on Rounds with the Respiratory Therapy Staff  Yevonne Pax, MD [facility] Pulmonary Critical Care Medicine Sleep Medicine

## 2018-07-09 DIAGNOSIS — J9621 Acute and chronic respiratory failure with hypoxia: Secondary | ICD-10-CM | POA: Diagnosis not present

## 2018-07-09 DIAGNOSIS — I469 Cardiac arrest, cause unspecified: Secondary | ICD-10-CM | POA: Diagnosis not present

## 2018-07-09 DIAGNOSIS — N170 Acute kidney failure with tubular necrosis: Secondary | ICD-10-CM | POA: Diagnosis not present

## 2018-07-09 DIAGNOSIS — I482 Chronic atrial fibrillation, unspecified: Secondary | ICD-10-CM | POA: Diagnosis not present

## 2018-07-09 NOTE — Progress Notes (Addendum)
Pulmonary Critical Care Medicine [facility] [place]   PULMONARY CRITICAL CARE SERVICE  PROGRESS NOTE  Date of Service: 07/09/2018  Cristina Norris  HEN:[id number]  DOB: 24-Oct-1965   DOA: 07/04/2018  Referring Physician: Carron Curie, MD  HPI: Cristina Norris is a 53 y.o. female seen for follow up of Acute on Chronic Respiratory Failure.  Patient continues to do well on pressure support at this time.  Goal is 16 hours today and she is currently doing well.  Her current FiO2 is 35%.  Medications: Reviewed on Rounds  Physical Exam:  Vitals: Pulse 70 respirations 20 blood pressure 136/87 O2 sat 96% temp 98.2  Ventilator Settings ventilator mode currently pressure support rate of 12 PEEP of 5 FiO2 35%  . General: Comfortable at this time . Eyes: Grossly normal lids, irises & conjunctiva . ENT: grossly tongue is normal . Neck: no obvious mass . Cardiovascular: S1 S2 normal no gallop . Respiratory: Coarse breath sounds noted . Abdomen: soft . Skin: no rash seen on limited exam . Musculoskeletal: not rigid . Psychiatric:unable to assess . Neurologic: no seizure no involuntary movements         Lab Data:   Basic Metabolic Panel: Recent Labs  Lab 07/05/18 0555 07/06/18 0628 07/07/18 0545 07/08/18 0616  NA 152* 151* 150* 145  K 5.7* 4.9 4.7 4.6  CL 120* 119* 119* 115*  CO2 24 25 26 25   GLUCOSE 304* 309* 203* 152*  BUN 85* 75* 62* 54*  CREATININE 1.55* 1.41* 1.30* 1.38*  CALCIUM 8.4* 8.0* 8.1* 8.2*  MG 2.2 2.1 2.1  --   PHOS 5.2* 4.6 3.8  --     ABG: Recent Labs  Lab 07/04/18 1905  PHART 7.404  PCO2ART 42.6  PO2ART 88.4  HCO3 26.1  O2SAT 96.6    Liver Function Tests: Recent Labs  Lab 07/05/18 0555 07/06/18 0628 07/07/18 0545  AST 24  --   --   ALT 34  --   --   ALKPHOS 46  --   --   BILITOT 0.8  --   --   PROT 7.0  --   --   ALBUMIN 2.3* 2.0* 2.0*   No results for input(s): LIPASE, AMYLASE in the last 168 hours. No results for  input(s): AMMONIA in the last 168 hours.  CBC: Recent Labs  Lab 07/05/18 0555 07/06/18 0628 07/07/18 0545  WBC 20.7* 18.4* 22.6*  NEUTROABS 18.0*  --   --   HGB 9.4* 8.7* 9.2*  HCT 31.3* 29.8* 30.0*  MCV 95.1 95.5 93.5  PLT 349 316 334    Cardiac Enzymes: No results for input(s): CKTOTAL, CKMB, CKMBINDEX, TROPONINI in the last 168 hours.  BNP (last 3 results) Recent Labs    07/06/18 0628  BNP 1,109.4*    ProBNP (last 3 results) No results for input(s): PROBNP in the last 8760 hours.  Radiological Exams: No results found.  Assessment/Plan Active Problems:   Acute on chronic respiratory failure with hypoxia (HCC)   Chronic atrial fibrillation with rapid ventricular response   Acute tubular necrosis (HCC)   Cardiac arrest (HCC)   Obstructive sleep apnea   1. Acute on chronic respiratory failure with hypoxia continue pressure support weaning as tolerated as well as aggressive pulmonary toilet 2. Chronic atrial fibrillation rate controlled 3. Acute tubular necrosis at baseline follow labs as available 4. Cardiac arrest rhythm stable 5. OSA nonissue at this time   I have personally seen and evaluated the patient,  evaluated laboratory and imaging results, formulated the assessment and plan and placed orders. The Patient requires high complexity decision making for assessment and support.  Case was discussed on Rounds with the Respiratory Therapy Staff  Allyne Gee, MD [facility] Pulmonary Critical Care Medicine Sleep Medicine

## 2018-07-10 LAB — CBC
HCT: 29 % — ABNORMAL LOW (ref 36.0–46.0)
Hemoglobin: 8.8 g/dL — ABNORMAL LOW (ref 12.0–15.0)
MCH: 27.8 pg (ref 26.0–34.0)
MCHC: 30.3 g/dL (ref 30.0–36.0)
MCV: 91.8 fL (ref 80.0–100.0)
Platelets: 297 10*3/uL (ref 150–400)
RBC: 3.16 MIL/uL — ABNORMAL LOW (ref 3.87–5.11)
RDW: 15.7 % — ABNORMAL HIGH (ref 11.5–15.5)
WBC: 13.7 10*3/uL — ABNORMAL HIGH (ref 4.0–10.5)
nRBC: 1 % — ABNORMAL HIGH (ref 0.0–0.2)

## 2018-07-10 LAB — BASIC METABOLIC PANEL
Anion gap: 7 (ref 5–15)
BUN: 52 mg/dL — ABNORMAL HIGH (ref 6–20)
CO2: 24 mmol/L (ref 22–32)
Calcium: 8.5 mg/dL — ABNORMAL LOW (ref 8.9–10.3)
Chloride: 113 mmol/L — ABNORMAL HIGH (ref 98–111)
Creatinine, Ser: 1.19 mg/dL — ABNORMAL HIGH (ref 0.44–1.00)
GFR calc Af Amer: 60 mL/min — ABNORMAL LOW (ref >60)
GFR calc non Af Amer: 52 mL/min — ABNORMAL LOW (ref >60)
Glucose, Bld: 114 mg/dL — ABNORMAL HIGH (ref 70–99)
Potassium: 4.4 mmol/L (ref 3.5–5.1)
Sodium: 144 mmol/L (ref 135–145)

## 2018-07-10 LAB — MAGNESIUM: Magnesium: 1.9 mg/dL (ref 1.7–2.4)

## 2018-07-10 NOTE — Progress Notes (Addendum)
Pulmonary Critical Care Medicine [facility] [place]   PULMONARY CRITICAL CARE SERVICE  PROGRESS NOTE  Date of Service: 07/10/2018  [name]  PHX:[id number]  DOB: 04-18-1966   DOA: 07/04/2018  Referring Physician: Carron Curie, MD  HPI: Cristina Norris is a 53 y.o. female seen for follow up of Acute on Chronic Respiratory Failure.  Patient was placed on trach collar with 40% FiO2 and did well for 6 hours however is back resting on the vent at this time due to increased work of breathing.  Medications: Reviewed on Rounds  Physical Exam:  Vitals: Pulse 70 respirations 20 blood pressure 147/69 O2 sat 97% temp 98.6  Ventilator Settings ventilator mode AC VC plus rate of 16 tidal volume 500 FiO2 35% PEEP of 5  . General: Comfortable at this time . Eyes: Grossly normal lids, irises & conjunctiva . ENT: grossly tongue is normal . Neck: no obvious mass . Cardiovascular: S1 S2 normal no gallop . Respiratory: No wheezes or rhonchi noted . Abdomen: soft . Skin: no rash seen on limited exam . Musculoskeletal: not rigid . Psychiatric:unable to assess . Neurologic: no seizure no involuntary movements         Lab Data:   Basic Metabolic Panel: Recent Labs  Lab 07/05/18 0555 07/06/18 0628 07/07/18 0545 07/08/18 0616 07/10/18 0555  NA 152* 151* 150* 145 144  K 5.7* 4.9 4.7 4.6 4.4  CL 120* 119* 119* 115* 113*  CO2 24 25 26 25 24   GLUCOSE 304* 309* 203* 152* 114*  BUN 85* 75* 62* 54* 52*  CREATININE 1.55* 1.41* 1.30* 1.38* 1.19*  CALCIUM 8.4* 8.0* 8.1* 8.2* 8.5*  MG 2.2 2.1 2.1  --  1.9  PHOS 5.2* 4.6 3.8  --   --     ABG: Recent Labs  Lab 07/04/18 1905  PHART 7.404  PCO2ART 42.6  PO2ART 88.4  HCO3 26.1  O2SAT 96.6    Liver Function Tests: Recent Labs  Lab 07/05/18 0555 07/06/18 0628 07/07/18 0545  AST 24  --   --   ALT 34  --   --   ALKPHOS 46  --   --   BILITOT 0.8  --   --   PROT 7.0  --   --   ALBUMIN 2.3* 2.0* 2.0*   No results  for input(s): LIPASE, AMYLASE in the last 168 hours. No results for input(s): AMMONIA in the last 168 hours.  CBC: Recent Labs  Lab 07/05/18 0555 07/06/18 0628 07/07/18 0545 07/10/18 0555  WBC 20.7* 18.4* 22.6* 13.7*  NEUTROABS 18.0*  --   --   --   HGB 9.4* 8.7* 9.2* 8.8*  HCT 31.3* 29.8* 30.0* 29.0*  MCV 95.1 95.5 93.5 91.8  PLT 349 316 334 297    Cardiac Enzymes: No results for input(s): CKTOTAL, CKMB, CKMBINDEX, TROPONINI in the last 168 hours.  BNP (last 3 results) Recent Labs    07/06/18 0628  BNP 1,109.4*    ProBNP (last 3 results) No results for input(s): PROBNP in the last 8760 hours.  Radiological Exams: No results found.  Assessment/Plan Active Problems:   Acute on chronic respiratory failure with hypoxia (HCC)   Chronic atrial fibrillation with rapid ventricular response   Acute tubular necrosis (HCC)   Cardiac arrest (HCC)   Obstructive sleep apnea   1. Acute on chronic respiratory failure with hypoxia continue weaning as tolerated per protocol and aggressive pulmonary toilet. 2. Chronic atrial fibrillation rate controlled 3. Acute tubular  necrosis at baseline continue to follow labs as available 4. Cardiac arrest rhythm stable 5. OSA nonissue at this time   I have personally seen and evaluated the patient, evaluated laboratory and imaging results, formulated the assessment and plan and placed orders. The Patient requires high complexity decision making for assessment and support.  Case was discussed on Rounds with the Respiratory Therapy Staff  Yevonne Pax, MD [facility] Pulmonary Critical Care Medicine Sleep Medicine

## 2018-07-11 DIAGNOSIS — J9621 Acute and chronic respiratory failure with hypoxia: Secondary | ICD-10-CM | POA: Diagnosis not present

## 2018-07-11 DIAGNOSIS — I482 Chronic atrial fibrillation, unspecified: Secondary | ICD-10-CM | POA: Diagnosis not present

## 2018-07-11 DIAGNOSIS — N170 Acute kidney failure with tubular necrosis: Secondary | ICD-10-CM | POA: Diagnosis not present

## 2018-07-11 DIAGNOSIS — I469 Cardiac arrest, cause unspecified: Secondary | ICD-10-CM | POA: Diagnosis not present

## 2018-07-11 LAB — BLOOD CULTURE ID PANEL (REFLEXED)
Acinetobacter baumannii: NOT DETECTED
Candida albicans: NOT DETECTED
Candida glabrata: DETECTED — AB
Candida krusei: NOT DETECTED
Candida parapsilosis: NOT DETECTED
Candida tropicalis: NOT DETECTED
Enterobacter cloacae complex: NOT DETECTED
Enterobacteriaceae species: NOT DETECTED
Enterococcus species: NOT DETECTED
Escherichia coli: NOT DETECTED
Haemophilus influenzae: NOT DETECTED
Klebsiella oxytoca: NOT DETECTED
Klebsiella pneumoniae: NOT DETECTED
Listeria monocytogenes: NOT DETECTED
Neisseria meningitidis: NOT DETECTED
Proteus species: NOT DETECTED
Pseudomonas aeruginosa: NOT DETECTED
Serratia marcescens: NOT DETECTED
Staphylococcus aureus (BCID): NOT DETECTED
Staphylococcus species: NOT DETECTED
Streptococcus agalactiae: NOT DETECTED
Streptococcus pneumoniae: NOT DETECTED
Streptococcus pyogenes: NOT DETECTED
Streptococcus species: NOT DETECTED

## 2018-07-11 NOTE — Progress Notes (Signed)
Pulmonary Critical Care Medicine [facility] [place]   PULMONARY CRITICAL CARE SERVICE  PROGRESS NOTE  Date of Service: 07/11/2018  Cristina Norris  CWC:[id number]  DOB: 1966/03/02   DOA: 07/04/2018  Referring Physician: Carron Curie, MD  HPI: Cristina Norris is a 53 y.o. female seen for follow up of Acute on Chronic Respiratory Failure.  At this time patient is on T collar currently on 35% FiO2 the goal is for 12 hours patient has been tolerating it fairly well  Medications: Reviewed on Rounds  Physical Exam:  Vitals: Temperature 98.3 pulse 72 respiratory 22 blood pressure 125/76 saturations 99%  Ventilator Settings currently on T collar FiO2 is 35%  . General: Comfortable at this time . Eyes: Grossly normal lids, irises & conjunctiva . ENT: grossly tongue is normal . Neck: no obvious mass . Cardiovascular: S1 S2 normal no gallop . Respiratory: Coarse breath sounds no rhonchi . Abdomen: soft . Skin: no rash seen on limited exam . Musculoskeletal: not rigid . Psychiatric:unable to assess . Neurologic: no seizure no involuntary movements         Lab Data:   Basic Metabolic Panel: Recent Labs  Lab 07/05/18 0555 07/06/18 0628 07/07/18 0545 07/08/18 0616 07/10/18 0555  NA 152* 151* 150* 145 144  K 5.7* 4.9 4.7 4.6 4.4  CL 120* 119* 119* 115* 113*  CO2 24 25 26 25 24   GLUCOSE 304* 309* 203* 152* 114*  BUN 85* 75* 62* 54* 52*  CREATININE 1.55* 1.41* 1.30* 1.38* 1.19*  CALCIUM 8.4* 8.0* 8.1* 8.2* 8.5*  MG 2.2 2.1 2.1  --  1.9  PHOS 5.2* 4.6 3.8  --   --     ABG: No results for input(s): PHART, PCO2ART, PO2ART, HCO3, O2SAT in the last 168 hours.  Liver Function Tests: Recent Labs  Lab 07/05/18 0555 07/06/18 0628 07/07/18 0545  AST 24  --   --   ALT 34  --   --   ALKPHOS 46  --   --   BILITOT 0.8  --   --   PROT 7.0  --   --   ALBUMIN 2.3* 2.0* 2.0*   No results for input(s): LIPASE, AMYLASE in the last 168 hours. No results for input(s):  AMMONIA in the last 168 hours.  CBC: Recent Labs  Lab 07/05/18 0555 07/06/18 0628 07/07/18 0545 07/10/18 0555  WBC 20.7* 18.4* 22.6* 13.7*  NEUTROABS 18.0*  --   --   --   HGB 9.4* 8.7* 9.2* 8.8*  HCT 31.3* 29.8* 30.0* 29.0*  MCV 95.1 95.5 93.5 91.8  PLT 349 316 334 297    Cardiac Enzymes: No results for input(s): CKTOTAL, CKMB, CKMBINDEX, TROPONINI in the last 168 hours.  BNP (last 3 results) Recent Labs    07/06/18 0628  BNP 1,109.4*    ProBNP (last 3 results) No results for input(s): PROBNP in the last 8760 hours.  Radiological Exams: No results found.  Assessment/Plan Active Problems:   Acute on chronic respiratory failure with hypoxia (HCC)   Chronic atrial fibrillation with rapid ventricular response   Acute tubular necrosis (HCC)   Cardiac arrest (HCC)   Obstructive sleep apnea   1. Acute on chronic respiratory failure with hypoxia we will continue with T collar trials continue pulmonary toilet supportive care. 2. Chronic atrial fibrillation rate controlled at this time 3. Acute tubular necrosis stable we will continue to monitor 4. Cardiac arrest rhythm is stable 5. Sleep apnea nonissue at this time  I have personally seen and evaluated the patient, evaluated laboratory and imaging results, formulated the assessment and plan and placed orders. The Patient requires high complexity decision making for assessment and support.  Case was discussed on Rounds with the Respiratory Therapy Staff  Allyne Gee, MD [facility] Pulmonary Critical Care Medicine Sleep Medicine

## 2018-07-12 DIAGNOSIS — N170 Acute kidney failure with tubular necrosis: Secondary | ICD-10-CM | POA: Diagnosis not present

## 2018-07-12 DIAGNOSIS — J9621 Acute and chronic respiratory failure with hypoxia: Secondary | ICD-10-CM | POA: Diagnosis not present

## 2018-07-12 DIAGNOSIS — I482 Chronic atrial fibrillation, unspecified: Secondary | ICD-10-CM | POA: Diagnosis not present

## 2018-07-12 DIAGNOSIS — I469 Cardiac arrest, cause unspecified: Secondary | ICD-10-CM | POA: Diagnosis not present

## 2018-07-12 LAB — CULTURE, BLOOD (ROUTINE X 2)
Culture: NO GROWTH
Special Requests: ADEQUATE
Special Requests: ADEQUATE

## 2018-07-12 NOTE — Progress Notes (Addendum)
Pulmonary Critical Care Medicine [facility] [place]   PULMONARY CRITICAL CARE SERVICE  PROGRESS NOTE  Date of Service: 07/12/2018  PATERICA Norris  POE:[id number]  DOB: 31-Aug-1965   DOA: 07/04/2018  Referring Physician: Carron Curie, MD  HPI: Cristina Norris is a 53 y.o. female seen for follow up of Acute on Chronic Respiratory Failure.  Patient is doing well on trach collar at this time 35% FiO2.  Her goal today is 16 hours.  PMV was attempted today and patient did not last more than an hour, before asking to take it off.  Medications: Reviewed on Rounds  Physical Exam:  Vitals: Pulse 53 respirations 19 blood pressure 129/77 O2 sat 100% temp 98.0  Ventilator Settings patient not currently on ventilator  . General: Comfortable at this time . Eyes: Grossly normal lids, irises & conjunctiva . ENT: grossly tongue is normal . Neck: no obvious mass . Cardiovascular: S1 S2 normal no gallop . Respiratory: no Wheezes or rhonchi noted . Abdomen: soft . Skin: no rash seen on limited exam . Musculoskeletal: not rigid . Psychiatric:unable to assess . Neurologic: no seizure no involuntary movements         Lab Data:   Basic Metabolic Panel: Recent Labs  Lab 07/06/18 0628 07/07/18 0545 07/08/18 0616 07/10/18 0555  NA 151* 150* 145 144  K 4.9 4.7 4.6 4.4  CL 119* 119* 115* 113*  CO2 25 26 25 24   GLUCOSE 309* 203* 152* 114*  BUN 75* 62* 54* 52*  CREATININE 1.41* 1.30* 1.38* 1.19*  CALCIUM 8.0* 8.1* 8.2* 8.5*  MG 2.1 2.1  --  1.9  PHOS 4.6 3.8  --   --     ABG: No results for input(s): PHART, PCO2ART, PO2ART, HCO3, O2SAT in the last 168 hours.  Liver Function Tests: Recent Labs  Lab 07/06/18 0628 07/07/18 0545  ALBUMIN 2.0* 2.0*   No results for input(s): LIPASE, AMYLASE in the last 168 hours. No results for input(s): AMMONIA in the last 168 hours.  CBC: Recent Labs  Lab 07/06/18 0628 07/07/18 0545 07/10/18 0555  WBC 18.4* 22.6* 13.7*  HGB 8.7*  9.2* 8.8*  HCT 29.8* 30.0* 29.0*  MCV 95.5 93.5 91.8  PLT 316 334 297    Cardiac Enzymes: No results for input(s): CKTOTAL, CKMB, CKMBINDEX, TROPONINI in the last 168 hours.  BNP (last 3 results) Recent Labs    07/06/18 0628  BNP 1,109.4*    ProBNP (last 3 results) No results for input(s): PROBNP in the last 8760 hours.  Radiological Exams: No results found.  Assessment/Plan Active Problems:   Acute on chronic respiratory failure with hypoxia (HCC)   Chronic atrial fibrillation with rapid ventricular response   Acute tubular necrosis (HCC)   Cardiac arrest (HCC)   Obstructive sleep apnea   1. Acute on chronic respiratory failure with hypoxia continue pulmonary toilet and supportive care as well as trach collar trials. 2. Chronic atrial fibrillation rate controlled 3. Acute tubular necrosis stable continue to monitor 4. Cardiac arrest rhythm stable 5. Sleep apnea nonissue   I have personally seen and evaluated the patient, evaluated laboratory and imaging results, formulated the assessment and plan and placed orders. The Patient requires high complexity decision making for assessment and support.  Case was discussed on Rounds with the Respiratory Therapy Staff  Yevonne Pax, MD [facility] Pulmonary Critical Care Medicine Sleep Medicine

## 2018-07-13 DIAGNOSIS — N170 Acute kidney failure with tubular necrosis: Secondary | ICD-10-CM | POA: Diagnosis not present

## 2018-07-13 DIAGNOSIS — I469 Cardiac arrest, cause unspecified: Secondary | ICD-10-CM | POA: Diagnosis not present

## 2018-07-13 DIAGNOSIS — J9621 Acute and chronic respiratory failure with hypoxia: Secondary | ICD-10-CM | POA: Diagnosis not present

## 2018-07-13 DIAGNOSIS — I482 Chronic atrial fibrillation, unspecified: Secondary | ICD-10-CM | POA: Diagnosis not present

## 2018-07-13 NOTE — Progress Notes (Addendum)
Pulmonary Critical Care Medicine [facility] [place]   PULMONARY CRITICAL CARE SERVICE  PROGRESS NOTE  Date of Service: 07/13/2018  AREN URISTA  PQA:[id number]  DOB: 12-17-1965   DOA: 07/04/2018  Referring Physician: Carron Curie, MD  HPI: ALEYLA SAMAND is a 53 y.o. female seen for follow up of Acute on Chronic Respiratory Failure.  Patient is doing well on trach collar FiO2 28%.  Goal today is 20 hours and patient is doing well with no difficulty.  Medications: Reviewed on Rounds  Physical Exam:  Vitals: Pulse 70 respirations 18 blood pressure 134/80 O2 sat 100% temp 98.2  Ventilator Settings patient's not currently on ventilator  . General: Comfortable at this time . Eyes: Grossly normal lids, irises & conjunctiva . ENT: grossly tongue is normal . Neck: no obvious mass . Cardiovascular: S1 S2 normal no gallop . Respiratory: No rales or rhonchi noted . Abdomen: soft . Skin: no rash seen on limited exam . Musculoskeletal: not rigid . Psychiatric:unable to assess . Neurologic: no seizure no involuntary movements         Lab Data:   Basic Metabolic Panel: Recent Labs  Lab 07/07/18 0545 07/08/18 0616 07/10/18 0555  NA 150* 145 144  K 4.7 4.6 4.4  CL 119* 115* 113*  CO2 26 25 24   GLUCOSE 203* 152* 114*  BUN 62* 54* 52*  CREATININE 1.30* 1.38* 1.19*  CALCIUM 8.1* 8.2* 8.5*  MG 2.1  --  1.9  PHOS 3.8  --   --     ABG: No results for input(s): PHART, PCO2ART, PO2ART, HCO3, O2SAT in the last 168 hours.  Liver Function Tests: Recent Labs  Lab 07/07/18 0545  ALBUMIN 2.0*   No results for input(s): LIPASE, AMYLASE in the last 168 hours. No results for input(s): AMMONIA in the last 168 hours.  CBC: Recent Labs  Lab 07/07/18 0545 07/10/18 0555  WBC 22.6* 13.7*  HGB 9.2* 8.8*  HCT 30.0* 29.0*  MCV 93.5 91.8  PLT 334 297    Cardiac Enzymes: No results for input(s): CKTOTAL, CKMB, CKMBINDEX, TROPONINI in the last 168 hours.  BNP  (last 3 results) Recent Labs    07/06/18 0628  BNP 1,109.4*    ProBNP (last 3 results) No results for input(s): PROBNP in the last 8760 hours.  Radiological Exams: No results found.  Assessment/Plan Active Problems:   Acute on chronic respiratory failure with hypoxia (HCC)   Chronic atrial fibrillation with rapid ventricular response   Acute tubular necrosis (HCC)   Cardiac arrest (HCC)   Obstructive sleep apnea   1. Acute on chronic respiratory failure with hypoxia continue pulmonary toilet and supportive care.  Continue weaning as per protocol. 2. Chronic atrial fibrillation rate controlled 3. Acute tubular necrosis stable continue to monitor 4. Cardiac arrest rhythm stable 5. Sleep apnea nonissue   I have personally seen and evaluated the patient, evaluated laboratory and imaging results, formulated the assessment and plan and placed orders. The Patient requires high complexity decision making for assessment and support.  Case was discussed on Rounds with the Respiratory Therapy Staff  Yevonne Pax, MD [facility] Pulmonary Critical Care Medicine Sleep Medicine

## 2018-07-14 ENCOUNTER — Other Ambulatory Visit (HOSPITAL_COMMUNITY): Payer: Medicaid Other

## 2018-07-14 DIAGNOSIS — J9621 Acute and chronic respiratory failure with hypoxia: Secondary | ICD-10-CM | POA: Diagnosis not present

## 2018-07-14 DIAGNOSIS — I482 Chronic atrial fibrillation, unspecified: Secondary | ICD-10-CM | POA: Diagnosis not present

## 2018-07-14 DIAGNOSIS — N170 Acute kidney failure with tubular necrosis: Secondary | ICD-10-CM | POA: Diagnosis not present

## 2018-07-14 DIAGNOSIS — I469 Cardiac arrest, cause unspecified: Secondary | ICD-10-CM | POA: Diagnosis not present

## 2018-07-14 LAB — BASIC METABOLIC PANEL
Anion gap: 9 (ref 5–15)
BUN: 29 mg/dL — ABNORMAL HIGH (ref 6–20)
CO2: 24 mmol/L (ref 22–32)
Calcium: 8.7 mg/dL — ABNORMAL LOW (ref 8.9–10.3)
Chloride: 109 mmol/L (ref 98–111)
Creatinine, Ser: 0.94 mg/dL (ref 0.44–1.00)
GFR calc Af Amer: >60 mL/min (ref >60)
GFR calc non Af Amer: >60 mL/min (ref >60)
Glucose, Bld: 150 mg/dL — ABNORMAL HIGH (ref 70–99)
Potassium: 3.8 mmol/L (ref 3.5–5.1)
Sodium: 142 mmol/L (ref 135–145)

## 2018-07-14 LAB — CBC
HCT: 26.9 % — ABNORMAL LOW (ref 36.0–46.0)
Hemoglobin: 8.1 g/dL — ABNORMAL LOW (ref 12.0–15.0)
MCH: 27.4 pg (ref 26.0–34.0)
MCHC: 30.1 g/dL (ref 30.0–36.0)
MCV: 90.9 fL (ref 80.0–100.0)
Platelets: 336 10*3/uL (ref 150–400)
RBC: 2.96 MIL/uL — ABNORMAL LOW (ref 3.87–5.11)
RDW: 17.1 % — ABNORMAL HIGH (ref 11.5–15.5)
WBC: 14 10*3/uL — ABNORMAL HIGH (ref 4.0–10.5)
nRBC: 0.6 % — ABNORMAL HIGH (ref 0.0–0.2)

## 2018-07-14 NOTE — Progress Notes (Signed)
[facility] [place]   PULMONARY CRITICAL CARE SERVICE  PROGRESS NOTE  Date of Service: 07/14/2018  Cristina Norris  ZOX:[id number]  DOB: 1966/01/19   DOA: 07/04/2018  Referring Physician: Carron CurieAli Hijazi, MD  HPI: Cristina Norris is a 53 y.o. female seen for follow up of Acute on Chronic Respiratory Failure.  Patient is on T collar did 19 hours yesterday plan is to continue to advance  Medications: Reviewed on Rounds  Physical Exam:  Vitals: Temperature 98.6 pulse 90 respiratory 25 blood pressure 165/96 saturations 100%  Ventilator Settings currently is on T collar off the ventilator  . General: Comfortable at this time . Eyes: Grossly normal lids, irises & conjunctiva . ENT: grossly tongue is normal . Neck: no obvious mass . Cardiovascular: S1 S2 normal no gallop . Respiratory: Coarse breath sounds no rhonchi are noted at this time . Abdomen: soft . Skin: no rash seen on limited exam . Musculoskeletal: not rigid . Psychiatric:unable to assess . Neurologic: no seizure no involuntary movements         Lab Data:   Basic Metabolic Panel: Recent Labs  Lab 07/08/18 0616 07/10/18 0555 07/14/18 0518  NA 145 144 142  K 4.6 4.4 3.8  CL 115* 113* 109  CO2 25 24 24   GLUCOSE 152* 114* 150*  BUN 54* 52* 29*  CREATININE 1.38* 1.19* 0.94  CALCIUM 8.2* 8.5* 8.7*  MG  --  1.9  --     ABG: No results for input(s): PHART, PCO2ART, PO2ART, HCO3, O2SAT in the last 168 hours.  Liver Function Tests: No results for input(s): AST, ALT, ALKPHOS, BILITOT, PROT, ALBUMIN in the last 168 hours. No results for input(s): LIPASE, AMYLASE in the last 168 hours. No results for input(s): AMMONIA in the last 168 hours.  CBC: Recent Labs  Lab 07/10/18 0555 07/14/18 0518  WBC 13.7* 14.0*  HGB 8.8* 8.1*  HCT 29.0* 26.9*  MCV 91.8 90.9  PLT 297 336    Cardiac Enzymes: No results for input(s): CKTOTAL, CKMB, CKMBINDEX, TROPONINI in the last  168 hours.  BNP (last 3 results) Recent Labs    07/06/18 0628  BNP 1,109.4*    ProBNP (last 3 results) No results for input(s): PROBNP in the last 8760 hours.  Radiological Exams: Dg Chest Port 1 View  Result Date: 07/14/2018 CLINICAL DATA:  Pulmonary edema EXAM: PORTABLE CHEST 1 VIEW COMPARISON:  Seven days ago FINDINGS: Tracheostomy tube that is well seated. An nasogastric tube reaches the stomach. Bilateral lung opacification that has progressed. This is mildly asymmetric dense on the right base. No effusion or pneumothorax. Vascular pedicle widening, with paratracheal adenopathy a possibility but not certain given the clinical circumstances. IMPRESSION: 1. Worsening airspace disease. This could be pulmonary edema but infection should also be considered given the asymmetric density at the right base. 2. Mediastinal widening needing continued follow-up to exclude paratracheal adenopathy. Electronically Signed   By: Marnee SpringJonathon  Watts M.D.   On: 07/14/2018 11:22    Assessment/Plan Active Problems:   Acute on chronic respiratory failure with hypoxia (HCC)   Chronic atrial fibrillation with rapid ventricular response   Acute tubular necrosis (HCC)   Cardiac arrest (HCC)   Obstructive sleep apnea   1. Acute on chronic respiratory failure with hypoxia continue with T collar weaning titrate oxygen continue pulmonary toilet the goal is 20 hours 2. Chronic atrial fibrillation rate controlled at this time we will continue to monitor 3. ATN labs improved 4.  Cardiac arrest rhythm stable 5. Sleep apnea nonissue at this time collar in place   I have personally seen and evaluated the patient, evaluated laboratory and imaging results, formulated the assessment and plan and placed orders. The Patient requires high complexity decision making for assessment and support.  Case was discussed on Rounds with the Respiratory Therapy Staff  Yevonne PaxSaadat A Khan, MD [facility] Pulmonary Critical Care Medicine Sleep  Medicine

## 2018-07-15 DIAGNOSIS — I469 Cardiac arrest, cause unspecified: Secondary | ICD-10-CM | POA: Diagnosis not present

## 2018-07-15 DIAGNOSIS — I482 Chronic atrial fibrillation, unspecified: Secondary | ICD-10-CM | POA: Diagnosis not present

## 2018-07-15 DIAGNOSIS — J9621 Acute and chronic respiratory failure with hypoxia: Secondary | ICD-10-CM | POA: Diagnosis not present

## 2018-07-15 DIAGNOSIS — N170 Acute kidney failure with tubular necrosis: Secondary | ICD-10-CM | POA: Diagnosis not present

## 2018-07-15 LAB — BASIC METABOLIC PANEL
Anion gap: 12 (ref 5–15)
BUN: 24 mg/dL — ABNORMAL HIGH (ref 6–20)
CO2: 27 mmol/L (ref 22–32)
Calcium: 8.7 mg/dL — ABNORMAL LOW (ref 8.9–10.3)
Chloride: 103 mmol/L (ref 98–111)
Creatinine, Ser: 0.9 mg/dL (ref 0.44–1.00)
GFR calc Af Amer: >60 mL/min (ref >60)
GFR calc non Af Amer: >60 mL/min (ref >60)
Glucose, Bld: 137 mg/dL — ABNORMAL HIGH (ref 70–99)
Potassium: 3.7 mmol/L (ref 3.5–5.1)
Sodium: 142 mmol/L (ref 135–145)

## 2018-07-15 NOTE — Progress Notes (Signed)
Pulmonary Critical Care Medicine [facility] [place]   PULMONARY CRITICAL CARE SERVICE  PROGRESS NOTE  Date of Service: 07/15/2018  Cristina Norris  [id number]  DOB: 1966/02/29   DOA: 07/04/2018  Referring Physician: Carron Curie, MD  HPI: Cristina Norris is a 53 y.o. female seen for follow up of Acute on Chronic Respiratory Failure.  Patient continues to wean on the protocol right now is on T collar is comfortable without distress at this time  Medications: Reviewed on Rounds  Physical Exam:  Vitals: Temperature 98.4 pulse 79 respiratory 19 blood pressure 160/95 saturations 100%  Ventilator Settings currently off the ventilator on T collar FiO2 28%  . General: Comfortable at this time . Eyes: Grossly normal lids, irises & conjunctiva . ENT: grossly tongue is normal . Neck: no obvious mass . Cardiovascular: S1 S2 normal no gallop . Respiratory: Coarse breath sounds few rhonchi are noted . Abdomen: soft . Skin: no rash seen on limited exam . Musculoskeletal: not rigid . Psychiatric:unable to assess . Neurologic: no seizure no involuntary movements         Lab Data:   Basic Metabolic Panel: Recent Labs  Lab 07/10/18 0555 07/14/18 0518 07/15/18 0706  NA 144 142 142  K 4.4 3.8 3.7  CL 113* 109 103  CO2 24 24 27   GLUCOSE 114* 150* 137*  BUN 52* 29* 24*  CREATININE 1.19* 0.94 0.90  CALCIUM 8.5* 8.7* 8.7*  MG 1.9  --   --     ABG: No results for input(s): PHART, PCO2ART, PO2ART, HCO3, O2SAT in the last 168 hours.  Liver Function Tests: No results for input(s): AST, ALT, ALKPHOS, BILITOT, PROT, ALBUMIN in the last 168 hours. No results for input(s): LIPASE, AMYLASE in the last 168 hours. No results for input(s): AMMONIA in the last 168 hours.  CBC: Recent Labs  Lab 07/10/18 0555 07/14/18 0518  WBC 13.7* 14.0*  HGB 8.8* 8.1*  HCT 29.0* 26.9*  MCV 91.8 90.9  PLT 297 336    Cardiac Enzymes: No results for input(s): CKTOTAL, CKMB,  CKMBINDEX, TROPONINI in the last 168 hours.  BNP (last 3 results) Recent Labs    07/06/18 0628  BNP 1,109.4*    ProBNP (last 3 results) No results for input(s): PROBNP in the last 8760 hours.  Radiological Exams: Dg Chest Port 1 View  Result Date: 07/14/2018 CLINICAL DATA:  Pulmonary edema EXAM: PORTABLE CHEST 1 VIEW COMPARISON:  Seven days ago FINDINGS: Tracheostomy tube that is well seated. An nasogastric tube reaches the stomach. Bilateral lung opacification that has progressed. This is mildly asymmetric dense on the right base. No effusion or pneumothorax. Vascular pedicle widening, with paratracheal adenopathy a possibility but not certain given the clinical circumstances. IMPRESSION: 1. Worsening airspace disease. This could be pulmonary edema but infection should also be considered given the asymmetric density at the right base. 2. Mediastinal widening needing continued follow-up to exclude paratracheal adenopathy. Electronically Signed   By: Marnee Spring M.D.   On: 07/14/2018 11:22    Assessment/Plan Active Problems:   Acute on chronic respiratory failure with hypoxia (HCC)   Chronic atrial fibrillation with rapid ventricular response   Acute tubular necrosis (HCC)   Cardiac arrest (HCC)   Obstructive sleep apnea   1. Acute on chronic respiratory failure hypoxia we will continue with the wean on T collar patient is tolerating well 2. Chronic atrial fibrillation rate controlled 3. ATN clinically improved 4. Cardiac arrest rhythm stable 5. OSA nonissue at  this time   I have personally seen and evaluated the patient, evaluated laboratory and imaging results, formulated the assessment and plan and placed orders. The Patient requires high complexity decision making for assessment and support.  Case was discussed on Rounds with the Respiratory Therapy Staff  Yevonne PaxSaadat A Khan, MD [facility] Pulmonary Critical Care Medicine Sleep Medicine

## 2018-07-16 ENCOUNTER — Other Ambulatory Visit (HOSPITAL_COMMUNITY): Payer: Medicaid Other

## 2018-07-16 DIAGNOSIS — I482 Chronic atrial fibrillation, unspecified: Secondary | ICD-10-CM | POA: Diagnosis not present

## 2018-07-16 DIAGNOSIS — I469 Cardiac arrest, cause unspecified: Secondary | ICD-10-CM | POA: Diagnosis not present

## 2018-07-16 DIAGNOSIS — J9621 Acute and chronic respiratory failure with hypoxia: Secondary | ICD-10-CM | POA: Diagnosis not present

## 2018-07-16 DIAGNOSIS — N170 Acute kidney failure with tubular necrosis: Secondary | ICD-10-CM | POA: Diagnosis not present

## 2018-07-16 NOTE — Progress Notes (Addendum)
[facility] [place]   PULMONARY CRITICAL CARE SERVICE  PROGRESS NOTE  Date of Service: 07/16/2018  Gerarda GuntherCarolyn F Haecker  ZOX:[id number]:[id number]  DOB: 1965-11-21   DOA: 07/04/2018  Referring Physician: Carron CurieAli Hijazi, MD  HPI: Gerarda GuntherCarolyn F Bibbins is a 53 y.o. female seen for follow up of Acute on Chronic Respiratory Failure.  Patient continues weaning per protocol, she is currently on trach collar at 35% without any distress.  The goal for today would be another 12 to 16 hours.  Medications: Reviewed on Rounds  Physical Exam:  Vitals: Pulse 76 respirations 18 blood pressure 115/71 O2 sat 92% temp 97.8  Ventilator Settings patient's not currently on ventilator  . General: Comfortable at this time . Eyes: Grossly normal lids, irises & conjunctiva . ENT: grossly tongue is normal . Neck: no obvious mass . Cardiovascular: S1 S2 normal no gallop . Respiratory: Coarse breath sounds few rhonchi noted . Abdomen: soft . Skin: no rash seen on limited exam . Musculoskeletal: not rigid . Psychiatric:unable to assess . Neurologic: no seizure no involuntary movements         Lab Data:   Basic Metabolic Panel: Recent Labs  Lab 07/10/18 0555 07/14/18 0518 07/15/18 0706  NA 144 142 142  K 4.4 3.8 3.7  CL 113* 109 103  CO2 24 24 27   GLUCOSE 114* 150* 137*  BUN 52* 29* 24*  CREATININE 1.19* 0.94 0.90  CALCIUM 8.5* 8.7* 8.7*  MG 1.9  --   --     ABG: No results for input(s): PHART, PCO2ART, PO2ART, HCO3, O2SAT in the last 168 hours.  Liver Function Tests: No results for input(s): AST, ALT, ALKPHOS, BILITOT, PROT, ALBUMIN in the last 168 hours. No results for input(s): LIPASE, AMYLASE in the last 168 hours. No results for input(s): AMMONIA in the last 168 hours.  CBC: Recent Labs  Lab 07/10/18 0555 07/14/18 0518  WBC 13.7* 14.0*  HGB 8.8* 8.1*  HCT 29.0* 26.9*  MCV 91.8 90.9  PLT 297 336    Cardiac Enzymes: No results for input(s): CKTOTAL,  CKMB, CKMBINDEX, TROPONINI in the last 168 hours.  BNP (last 3 results) Recent Labs    07/06/18 0628  BNP 1,109.4*    ProBNP (last 3 results) No results for input(s): PROBNP in the last 8760 hours.  Radiological Exams: No results found.  Assessment/Plan Active Problems:   Acute on chronic respiratory failure with hypoxia (HCC)   Chronic atrial fibrillation with rapid ventricular response   Acute tubular necrosis (HCC)   Cardiac arrest (HCC)   Obstructive sleep apnea   1. Acute on chronic respiratory failure with hypoxia continue to wean on trach collar per protocol. 2. Chronic atrial fibrillation rate controlled 3. ATN clinically improved 4. Cardiac arrest rhythm stable 5. OSA nonissue at this time   I have personally seen and evaluated the patient, evaluated laboratory and imaging results, formulated the assessment and plan and placed orders. The Patient requires high complexity decision making for assessment and support.  Case was discussed on Rounds with the Respiratory Therapy Staff  Yevonne PaxSaadat A , MD [facility] Pulmonary Critical Care Medicine Sleep Medicine

## 2018-07-17 ENCOUNTER — Other Ambulatory Visit (HOSPITAL_COMMUNITY): Payer: Medicaid Other

## 2018-07-17 DIAGNOSIS — I482 Chronic atrial fibrillation, unspecified: Secondary | ICD-10-CM | POA: Diagnosis not present

## 2018-07-17 DIAGNOSIS — J9621 Acute and chronic respiratory failure with hypoxia: Secondary | ICD-10-CM | POA: Diagnosis not present

## 2018-07-17 DIAGNOSIS — N170 Acute kidney failure with tubular necrosis: Secondary | ICD-10-CM | POA: Diagnosis not present

## 2018-07-17 DIAGNOSIS — I469 Cardiac arrest, cause unspecified: Secondary | ICD-10-CM | POA: Diagnosis not present

## 2018-07-17 NOTE — Progress Notes (Addendum)
[facility] [place]   PULMONARY CRITICAL CARE SERVICE  PROGRESS NOTE  Date of Service: 07/17/2018  Cristina GuntherCarolyn F Norris  WUJ:[id number]  DOB: 1966-01-17   DOA: 07/04/2018  Referring Physician: Carron CurieAli Hijazi, MD  HPI: Cristina Norris is a 53 y.o. female seen for follow up of Acute on Chronic Respiratory Failure.  Patient is currently on trach collar at 35% FiO2 with a goal of 24 hours.  RT reports patient is doing exceptionally well and they will continue as patient can tolerate.  Medications: Reviewed on Rounds  Physical Exam:  Vitals: Pulse 62 respirations 20 BP 122/74 O2 sat 97% temp 97.9  Ventilator Settings patient's not currently on ventilator  . General: Comfortable at this time . Eyes: Grossly normal lids, irises & conjunctiva . ENT: grossly tongue is normal . Neck: no obvious mass . Cardiovascular: S1 S2 normal no gallop . Respiratory: No rales or rhonchi noted . Abdomen: soft . Skin: no rash seen on limited exam . Musculoskeletal: not rigid . Psychiatric:unable to assess . Neurologic: no seizure no involuntary movements         Lab Data:   Basic Metabolic Panel: Recent Labs  Lab 07/14/18 0518 07/15/18 0706  NA 142 142  K 3.8 3.7  CL 109 103  CO2 24 27  GLUCOSE 150* 137*  BUN 29* 24*  CREATININE 0.94 0.90  CALCIUM 8.7* 8.7*    ABG: No results for input(s): PHART, PCO2ART, PO2ART, HCO3, O2SAT in the last 168 hours.  Liver Function Tests: No results for input(s): AST, ALT, ALKPHOS, BILITOT, PROT, ALBUMIN in the last 168 hours. No results for input(s): LIPASE, AMYLASE in the last 168 hours. No results for input(s): AMMONIA in the last 168 hours.  CBC: Recent Labs  Lab 07/14/18 0518  WBC 14.0*  HGB 8.1*  HCT 26.9*  MCV 90.9  PLT 336    Cardiac Enzymes: No results for input(s): CKTOTAL, CKMB, CKMBINDEX, TROPONINI in the last 168 hours.  BNP (last 3 results) Recent Labs    07/06/18 0628  BNP 1,109.4*     ProBNP (last 3 results) No results for input(s): PROBNP in the last 8760 hours.  Radiological Exams: Dg Chest Port 1 View  Result Date: 07/17/2018 CLINICAL DATA:  Pulmonary edema.  Follow-up exam. EXAM: PORTABLE CHEST 1 VIEW COMPARISON:  07/14/2018 FINDINGS: There is improved lung base opacity, most evident on the right, compared to the most recent prior study. No new lung abnormalities. No convincing pleural effusion.  No pneumothorax. Tracheostomy tube and nasal/orogastric tube are stable in well positioned. IMPRESSION: 1. Improved lung aeration. Improved lung base opacities most evident on the right, which may reflect improved asymmetric pulmonary edema or improved infection. No new abnormalities. Electronically Signed   By: Amie Portlandavid  Ormond M.D.   On: 07/17/2018 12:59    Assessment/Plan Active Problems:   Acute on chronic respiratory failure with hypoxia (HCC)   Chronic atrial fibrillation with rapid ventricular response   Acute tubular necrosis (HCC)   Cardiac arrest (HCC)   Obstructive sleep apnea   1. Acute on chronic respiratory failure with hypoxia continue to wean on trach collar per protocol. 2. Chronic atrial fibrillation rate controlled 3. ATN clinically improved 4. Cardiac arrest rhythm stable 5. OSA nonissue at this time   I have personally seen and evaluated the patient, evaluated laboratory and imaging results, formulated the assessment and plan and placed orders. The Patient requires high complexity decision making for assessment and support.  Case  was discussed on Rounds with the Respiratory Therapy Staff  Allyne Gee, MD [facility] Pulmonary Critical Care Medicine Sleep Medicine

## 2018-07-18 DIAGNOSIS — I469 Cardiac arrest, cause unspecified: Secondary | ICD-10-CM | POA: Diagnosis not present

## 2018-07-18 DIAGNOSIS — N170 Acute kidney failure with tubular necrosis: Secondary | ICD-10-CM | POA: Diagnosis not present

## 2018-07-18 DIAGNOSIS — I482 Chronic atrial fibrillation, unspecified: Secondary | ICD-10-CM | POA: Diagnosis not present

## 2018-07-18 DIAGNOSIS — J9621 Acute and chronic respiratory failure with hypoxia: Secondary | ICD-10-CM | POA: Diagnosis not present

## 2018-07-18 LAB — CBC
HCT: 24.2 % — ABNORMAL LOW (ref 36.0–46.0)
Hemoglobin: 7.3 g/dL — ABNORMAL LOW (ref 12.0–15.0)
MCH: 27 pg (ref 26.0–34.0)
MCHC: 30.2 g/dL (ref 30.0–36.0)
MCV: 89.6 fL (ref 80.0–100.0)
Platelets: 335 10*3/uL (ref 150–400)
RBC: 2.7 MIL/uL — ABNORMAL LOW (ref 3.87–5.11)
RDW: 16.5 % — ABNORMAL HIGH (ref 11.5–15.5)
WBC: 9.5 10*3/uL (ref 4.0–10.5)
nRBC: 0.5 % — ABNORMAL HIGH (ref 0.0–0.2)

## 2018-07-18 LAB — RENAL FUNCTION PANEL
Albumin: 1.9 g/dL — ABNORMAL LOW (ref 3.5–5.0)
Anion gap: 13 (ref 5–15)
BUN: 37 mg/dL — ABNORMAL HIGH (ref 6–20)
CO2: 29 mmol/L (ref 22–32)
Calcium: 8.3 mg/dL — ABNORMAL LOW (ref 8.9–10.3)
Chloride: 100 mmol/L (ref 98–111)
Creatinine, Ser: 1.01 mg/dL — ABNORMAL HIGH (ref 0.44–1.00)
GFR calc Af Amer: >60 mL/min (ref >60)
GFR calc non Af Amer: >60 mL/min (ref >60)
Glucose, Bld: 152 mg/dL — ABNORMAL HIGH (ref 70–99)
Phosphorus: 4.9 mg/dL — ABNORMAL HIGH (ref 2.5–4.6)
Potassium: 3.1 mmol/L — ABNORMAL LOW (ref 3.5–5.1)
Sodium: 142 mmol/L (ref 135–145)

## 2018-07-18 LAB — MAGNESIUM: Magnesium: 1.4 mg/dL — ABNORMAL LOW (ref 1.7–2.4)

## 2018-07-18 NOTE — Progress Notes (Signed)
Pulmonary Critical Care Medicine [facility] [place]   PULMONARY CRITICAL CARE SERVICE  PROGRESS NOTE  [facility]: [facility]  Cristina GuntherCarolyn F Tindel  ZOX:[id number]:[id number]  DOB: 1965/12/26   DOA: 07/04/2018  Referring Physician: Carron CurieAli Hijazi, MD  HPI: Cristina Norris is a 53 y.o. female seen for follow up of Acute on Chronic Respiratory Failure.  Patient is on T collar has been off the ventilator for more than 24 hours now  Medications: Reviewed on Rounds  Physical Exam:  Vitals: Temperature 98.8 pulse 72 respiratory 29 blood pressure 122/76 saturations 99%  Ventilator Settings currently is on T collar FiO2 28%  . General: Comfortable at this time . Eyes: Grossly normal lids, irises & conjunctiva . ENT: grossly tongue is normal . Neck: no obvious mass . Cardiovascular: S1 S2 normal no gallop . Respiratory: Scattered rhonchi expansion are equal at this time . Abdomen: soft . Skin: no rash seen on limited exam . Musculoskeletal: not rigid . Psychiatric:unable to assess . Neurologic: no seizure no involuntary movements         Lab Data:   Basic Metabolic Panel: Recent Labs  Lab 07/14/18 0518 07/15/18 0706 07/18/18 0635  NA 142 142 142  K 3.8 3.7 3.1*  CL 109 103 100  CO2 24 27 29   GLUCOSE 150* 137* 152*  BUN 29* 24* 37*  CREATININE 0.94 0.90 1.01*  CALCIUM 8.7* 8.7* 8.3*  MG  --   --  1.4*  PHOS  --   --  4.9*    ABG: No results for input(s): PHART, PCO2ART, PO2ART, HCO3, O2SAT in the last 168 hours.  Liver Function Tests: Recent Labs  Lab 07/18/18 0635  ALBUMIN 1.9*   No results for input(s): LIPASE, AMYLASE in the last 168 hours. No results for input(s): AMMONIA in the last 168 hours.  CBC: Recent Labs  Lab 07/14/18 0518 07/18/18 0635  WBC 14.0* 9.5  HGB 8.1* 7.3*  HCT 26.9* 24.2*  MCV 90.9 89.6  PLT 336 335    Cardiac Enzymes: No results for input(s): CKTOTAL, CKMB, CKMBINDEX, TROPONINI in the last 168 hours.  BNP (last 3  results) Recent Labs    07/06/18 0628  BNP 1,109.4*    ProBNP (last 3 results) No results for input(s): PROBNP in the last 8760 hours.  Radiological Exams: Dg Chest Port 1 View  Result Date: 07/17/2018 CLINICAL DATA:  Pulmonary edema.  Follow-up exam. EXAM: PORTABLE CHEST 1 VIEW COMPARISON:  07/14/2018 FINDINGS: There is improved lung base opacity, most evident on the right, compared to the most recent prior study. No new lung abnormalities. No convincing pleural effusion.  No pneumothorax. Tracheostomy tube and nasal/orogastric tube are stable in well positioned. IMPRESSION: 1. Improved lung aeration. Improved lung base opacities most evident on the right, which may reflect improved asymmetric pulmonary edema or improved infection. No new abnormalities. Electronically Signed   By: Amie Portlandavid  Ormond M.D.   On: 07/17/2018 12:59    Assessment/Plan Active Problems:   Acute on chronic respiratory failure with hypoxia (HCC)   Chronic atrial fibrillation with rapid ventricular response   Acute tubular necrosis (HCC)   Cardiac arrest (HCC)   Obstructive sleep apnea   1. Acute on chronic respiratory failure with hypoxia we will continue with T collar trials continue pulmonary toilet supportive care 2. Chronic atrial fibrillation rate control at this time continue supportive care 3. ATN follow labs improved 4. Cardiac arrest rhythm is stable 5. Obstructive sleep apnea will be the limiting factor  as far as being able to decannulate we will continue to monitor   I have personally seen and evaluated the patient, evaluated laboratory and imaging results, formulated the assessment and plan and placed orders. The Patient requires high complexity decision making for assessment and support.  Case was discussed on Rounds with the Respiratory Therapy Staff  Yevonne Pax, MD [facility] Pulmonary Critical Care Medicine Sleep Medicine

## 2018-07-19 ENCOUNTER — Other Ambulatory Visit (HOSPITAL_COMMUNITY): Payer: Medicaid Other

## 2018-07-19 DIAGNOSIS — I482 Chronic atrial fibrillation, unspecified: Secondary | ICD-10-CM | POA: Diagnosis not present

## 2018-07-19 DIAGNOSIS — I469 Cardiac arrest, cause unspecified: Secondary | ICD-10-CM | POA: Diagnosis not present

## 2018-07-19 DIAGNOSIS — J9621 Acute and chronic respiratory failure with hypoxia: Secondary | ICD-10-CM | POA: Diagnosis not present

## 2018-07-19 DIAGNOSIS — N170 Acute kidney failure with tubular necrosis: Secondary | ICD-10-CM | POA: Diagnosis not present

## 2018-07-19 LAB — POTASSIUM: Potassium: 3.5 mmol/L (ref 3.5–5.1)

## 2018-07-19 NOTE — Progress Notes (Signed)
Pulmonary Critical Care Medicine [facility] [place]   PULMONARY CRITICAL CARE SERVICE  PROGRESS NOTE  Date of Service: 07/19/2018  Cristina Norris  [id number]  DOB: 12-18-1965   DOA: 07/04/2018  Referring Physician: Carron Curie, MD  HPI: Cristina Norris is a 53 y.o. female seen for follow up of Acute on Chronic Respiratory Failure.  Patient is on T collar the goal is for 48 hours seems to be tolerating it well so far  Medications: Reviewed on Rounds  Physical Exam:  Vitals: Temperature 97.2 pulse 71 respiratory 20 blood pressure 144/85 saturations 99%  Ventilator Settings on T collar FiO2 28%  . General: Comfortable at this time . Eyes: Grossly normal lids, irises & conjunctiva . ENT: grossly tongue is normal . Neck: no obvious mass . Cardiovascular: S1 S2 normal no gallop . Respiratory: No rhonchi or rales are noted at this time . Abdomen: soft . Skin: no rash seen on limited exam . Musculoskeletal: not rigid . Psychiatric:unable to assess . Neurologic: no seizure no involuntary movements         Lab Data:   Basic Metabolic Panel: Recent Labs  Lab 07/14/18 0518 07/15/18 0706 07/18/18 0635 07/19/18 0946  NA 142 142 142  --   K 3.8 3.7 3.1* 3.5  CL 109 103 100  --   CO2 24 27 29   --   GLUCOSE 150* 137* 152*  --   BUN 29* 24* 37*  --   CREATININE 0.94 0.90 1.01*  --   CALCIUM 8.7* 8.7* 8.3*  --   MG  --   --  1.4*  --   PHOS  --   --  4.9*  --     ABG: No results for input(s): PHART, PCO2ART, PO2ART, HCO3, O2SAT in the last 168 hours.  Liver Function Tests: Recent Labs  Lab 07/18/18 0635  ALBUMIN 1.9*   No results for input(s): LIPASE, AMYLASE in the last 168 hours. No results for input(s): AMMONIA in the last 168 hours.  CBC: Recent Labs  Lab 07/14/18 0518 07/18/18 0635  WBC 14.0* 9.5  HGB 8.1* 7.3*  HCT 26.9* 24.2*  MCV 90.9 89.6  PLT 336 335    Cardiac Enzymes: No results for input(s): CKTOTAL, CKMB, CKMBINDEX,  TROPONINI in the last 168 hours.  BNP (last 3 results) Recent Labs    07/06/18 0628  BNP 1,109.4*    ProBNP (last 3 results) No results for input(s): PROBNP in the last 8760 hours.  Radiological Exams: Dg Chest Port 1 View  Result Date: 07/17/2018 CLINICAL DATA:  Pulmonary edema.  Follow-up exam. EXAM: PORTABLE CHEST 1 VIEW COMPARISON:  07/14/2018 FINDINGS: There is improved lung base opacity, most evident on the right, compared to the most recent prior study. No new lung abnormalities. No convincing pleural effusion.  No pneumothorax. Tracheostomy tube and nasal/orogastric tube are stable in well positioned. IMPRESSION: 1. Improved lung aeration. Improved lung base opacities most evident on the right, which may reflect improved asymmetric pulmonary edema or improved infection. No new abnormalities. Electronically Signed   By: Amie Portland M.D.   On: 07/17/2018 12:59    Assessment/Plan Active Problems:   Acute on chronic respiratory failure with hypoxia (HCC)   Chronic atrial fibrillation with rapid ventricular response   Acute tubular necrosis (HCC)   Cardiac arrest (HCC)   Obstructive sleep apnea   1. Acute on chronic respiratory failure with hypoxia we will continue with T collar trials continue secretion management pulmonary toilet  2. Chronic atrial fibrillation rate controlled we will continue to follow along 3. Acute tubular necrosis resolved 4. Cardiac arrest rhythm stable 5. OSA nonissue at this time   I have personally seen and evaluated the patient, evaluated laboratory and imaging results, formulated the assessment and plan and placed orders. The Patient requires high complexity decision making for assessment and support.  Case was discussed on Rounds with the Respiratory Therapy Staff  Yevonne Pax, MD [facility] Pulmonary Critical Care Medicine Sleep Medicine

## 2018-07-20 DIAGNOSIS — N170 Acute kidney failure with tubular necrosis: Secondary | ICD-10-CM | POA: Diagnosis not present

## 2018-07-20 DIAGNOSIS — I469 Cardiac arrest, cause unspecified: Secondary | ICD-10-CM | POA: Diagnosis not present

## 2018-07-20 DIAGNOSIS — J9621 Acute and chronic respiratory failure with hypoxia: Secondary | ICD-10-CM | POA: Diagnosis not present

## 2018-07-20 DIAGNOSIS — I482 Chronic atrial fibrillation, unspecified: Secondary | ICD-10-CM | POA: Diagnosis not present

## 2018-07-20 NOTE — Progress Notes (Signed)
Pulmonary Critical Care Medicine [facility] [place]   PULMONARY CRITICAL CARE SERVICE  PROGRESS NOTE  Date of Service: 07/20/2018  Cristina SCHLOTFELDT  TGG:[id number]  DOB: 1965/08/01   DOA: 07/04/2018  Referring Physician: Carron Curie, MD  HPI: Cristina Norris is a 53 y.o. female seen for follow up of Acute on Chronic Respiratory Failure.  Patient currently is on T collar has been on 28% FiO2 has also been tolerating the PMV fairly well.  Medications: Reviewed on Rounds  Physical Exam:  Vitals: Temperature 98.7 pulse 73 respiratory rate 20 blood pressure 139/78 saturations 98%  Ventilator Settings off the ventilator on T collar  . General: Comfortable at this time . Eyes: Grossly normal lids, irises & conjunctiva . ENT: grossly tongue is normal . Neck: no obvious mass . Cardiovascular: S1 S2 normal no gallop . Respiratory: No rhonchi or rales are noted at this time . Abdomen: soft . Skin: no rash seen on limited exam . Musculoskeletal: not rigid . Psychiatric:unable to assess . Neurologic: no seizure no involuntary movements         Lab Data:   Basic Metabolic Panel: Recent Labs  Lab 07/14/18 0518 07/15/18 0706 07/18/18 0635 07/19/18 0946  NA 142 142 142  --   K 3.8 3.7 3.1* 3.5  CL 109 103 100  --   CO2 24 27 29   --   GLUCOSE 150* 137* 152*  --   BUN 29* 24* 37*  --   CREATININE 0.94 0.90 1.01*  --   CALCIUM 8.7* 8.7* 8.3*  --   MG  --   --  1.4*  --   PHOS  --   --  4.9*  --     ABG: No results for input(s): PHART, PCO2ART, PO2ART, HCO3, O2SAT in the last 168 hours.  Liver Function Tests: Recent Labs  Lab 07/18/18 0635  ALBUMIN 1.9*   No results for input(s): LIPASE, AMYLASE in the last 168 hours. No results for input(s): AMMONIA in the last 168 hours.  CBC: Recent Labs  Lab 07/14/18 0518 07/18/18 0635  WBC 14.0* 9.5  HGB 8.1* 7.3*  HCT 26.9* 24.2*  MCV 90.9 89.6  PLT 336 335    Cardiac Enzymes: No results for input(s):  CKTOTAL, CKMB, CKMBINDEX, TROPONINI in the last 168 hours.  BNP (last 3 results) Recent Labs    07/06/18 0628  BNP 1,109.4*    ProBNP (last 3 results) No results for input(s): PROBNP in the last 8760 hours.  Radiological Exams: No results found.  Assessment/Plan Active Problems:   Acute on chronic respiratory failure with hypoxia (HCC)   Chronic atrial fibrillation with rapid ventricular response   Acute tubular necrosis (HCC)   Cardiac arrest (HCC)   Obstructive sleep apnea   1. Acute on chronic respiratory failure with hypoxia continue to wean on T collar titrate oxygen continue secretion management pulmonary toilet. 2. ATN treated we will continue to follow 3. Cardiac arrest rhythm is stable 4. Sleep apnea nonissue at this time. 5. Chronic atrial fibrillation rate controlled at this time we will continue to monitor   I have personally seen and evaluated the patient, evaluated laboratory and imaging results, formulated the assessment and plan and placed orders. The Patient requires high complexity decision making for assessment and support.  Case was discussed on Rounds with the Respiratory Therapy Staff  Yevonne Pax, MD [facility] Pulmonary Critical Care Medicine Sleep Medicine

## 2018-07-21 ENCOUNTER — Other Ambulatory Visit (HOSPITAL_COMMUNITY): Payer: Medicaid Other

## 2018-07-21 DIAGNOSIS — N170 Acute kidney failure with tubular necrosis: Secondary | ICD-10-CM | POA: Diagnosis not present

## 2018-07-21 DIAGNOSIS — I469 Cardiac arrest, cause unspecified: Secondary | ICD-10-CM | POA: Diagnosis not present

## 2018-07-21 DIAGNOSIS — I482 Chronic atrial fibrillation, unspecified: Secondary | ICD-10-CM | POA: Diagnosis not present

## 2018-07-21 DIAGNOSIS — J9621 Acute and chronic respiratory failure with hypoxia: Secondary | ICD-10-CM | POA: Diagnosis not present

## 2018-07-21 NOTE — Progress Notes (Signed)
[facility] [place]   PULMONARY CRITICAL CARE SERVICE  PROGRESS NOTE  Date of Service: 07/21/2018  Cristina GuntherCarolyn F Norris  WUJ:[id number]  DOB: 23-Feb-1966   DOA: 07/04/2018  Referring Physician: Carron CurieAli Hijazi, MD  HPI: Cristina Norris is a 53 y.o. female seen for follow up of Acute on Chronic Respiratory Failure.  Patient currently is on T collar has been on PMV tolerating well  Medications: Reviewed on Rounds  Physical Exam:  Vitals: Temperature 98.0 pulse 74 respiratory 23 blood pressure 130/77 saturation 97%  Ventilator Settings off the ventilator on T collar with PMV  . General: Comfortable at this time . Eyes: Grossly normal lids, irises & conjunctiva . ENT: grossly tongue is normal . Neck: no obvious mass . Cardiovascular: S1 S2 normal no gallop . Respiratory: No rhonchi or rales are noted at this time . Abdomen: soft . Skin: no rash seen on limited exam . Musculoskeletal: not rigid . Psychiatric:unable to assess . Neurologic: no seizure no involuntary movements         Lab Data:   Basic Metabolic Panel: Recent Labs  Lab 07/15/18 0706 07/18/18 0635 07/19/18 0946  NA 142 142  --   K 3.7 3.1* 3.5  CL 103 100  --   CO2 27 29  --   GLUCOSE 137* 152*  --   BUN 24* 37*  --   CREATININE 0.90 1.01*  --   CALCIUM 8.7* 8.3*  --   MG  --  1.4*  --   PHOS  --  4.9*  --     ABG: No results for input(s): PHART, PCO2ART, PO2ART, HCO3, O2SAT in the last 168 hours.  Liver Function Tests: Recent Labs  Lab 07/18/18 0635  ALBUMIN 1.9*   No results for input(s): LIPASE, AMYLASE in the last 168 hours. No results for input(s): AMMONIA in the last 168 hours.  CBC: Recent Labs  Lab 07/18/18 0635  WBC 9.5  HGB 7.3*  HCT 24.2*  MCV 89.6  PLT 335    Cardiac Enzymes: No results for input(s): CKTOTAL, CKMB, CKMBINDEX, TROPONINI in the last 168 hours.  BNP (last 3 results) Recent Labs    07/06/18 0628  BNP 1,109.4*     ProBNP (last 3 results) No results for input(s): PROBNP in the last 8760 hours.  Radiological Exams: No results found.  Assessment/Plan Active Problems:   Acute on chronic respiratory failure with hypoxia (HCC)   Chronic atrial fibrillation with rapid ventricular response   Acute tubular necrosis (HCC)   Cardiac arrest (HCC)   Obstructive sleep apnea   1. Acute on chronic respiratory failure hypoxia we will continue with PMV ENT collar continue aggressive pulmonary toilet secretion management. 2. Chronic atrial fibrillation rate controlled at this time 3. ATN continue to monitor labs 4. Cardiac arrest rhythm stable 5. 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 The Patient requires high complexity decision making for assessment and support.  Case was discussed on Rounds with the Respiratory Therapy Staff  Yevonne PaxSaadat A Lexiana Spindel, MD [facility] Pulmonary Critical Care Medicine Sleep Medicine

## 2018-07-22 DIAGNOSIS — J9621 Acute and chronic respiratory failure with hypoxia: Secondary | ICD-10-CM | POA: Diagnosis not present

## 2018-07-22 DIAGNOSIS — I469 Cardiac arrest, cause unspecified: Secondary | ICD-10-CM | POA: Diagnosis not present

## 2018-07-22 DIAGNOSIS — I482 Chronic atrial fibrillation, unspecified: Secondary | ICD-10-CM | POA: Diagnosis not present

## 2018-07-22 DIAGNOSIS — N170 Acute kidney failure with tubular necrosis: Secondary | ICD-10-CM | POA: Diagnosis not present

## 2018-07-22 LAB — CULTURE, BLOOD (ROUTINE X 2)
Culture: NO GROWTH
Culture: NO GROWTH
Special Requests: ADEQUATE
Special Requests: ADEQUATE

## 2018-07-22 NOTE — Progress Notes (Signed)
Pulmonary Critical Care Medicine [facility] [place]   PULMONARY CRITICAL CARE SERVICE  PROGRESS NOTE  Date of Service: 07/22/2018  Cristina Norris  [id number]:[id number]  DOB: 1965/10/21   DOA: 07/04/2018  Referring Physician: Carron Curie, MD  HPI: Cristina Norris is a 53 y.o. female seen for follow up of Acute on Chronic Respiratory Failure.  At this time is on T collar copious amounts of secretions are noted at this time.  Patient requiring frequent suctioning  Medications: Reviewed on Rounds  Physical Exam:  Vitals: Temperature 97.5 pulse 76 respiratory 22 blood pressure 144/82 saturations 96%  Ventilator Settings mode of ventilation is off the ventilator on T collar FiO2 28%  . General: Comfortable at this time . Eyes: Grossly normal lids, irises & conjunctiva . ENT: grossly tongue is normal . Neck: no obvious mass . Cardiovascular: S1 S2 normal no gallop . Respiratory: No rhonchi or coarse breath sounds . Abdomen: soft . Skin: no rash seen on limited exam . Musculoskeletal: not rigid . Psychiatric:unable to assess . Neurologic: no seizure no involuntary movements         Lab Data:   Basic Metabolic Panel: Recent Labs  Lab 07/18/18 0635 07/19/18 0946  NA 142  --   K 3.1* 3.5  CL 100  --   CO2 29  --   GLUCOSE 152*  --   BUN 37*  --   CREATININE 1.01*  --   CALCIUM 8.3*  --   MG 1.4*  --   PHOS 4.9*  --     ABG: No results for input(s): PHART, PCO2ART, PO2ART, HCO3, O2SAT in the last 168 hours.  Liver Function Tests: Recent Labs  Lab 07/18/18 0635  ALBUMIN 1.9*   No results for input(s): LIPASE, AMYLASE in the last 168 hours. No results for input(s): AMMONIA in the last 168 hours.  CBC: Recent Labs  Lab 07/18/18 0635  WBC 9.5  HGB 7.3*  HCT 24.2*  MCV 89.6  PLT 335    Cardiac Enzymes: No results for input(s): CKTOTAL, CKMB, CKMBINDEX, TROPONINI in the last 168 hours.  BNP (last 3 results) Recent Labs    07/06/18 0628  BNP  1,109.4*    ProBNP (last 3 results) No results for input(s): PROBNP in the last 8760 hours.  Radiological Exams: No results found.  Assessment/Plan Active Problems:   Acute on chronic respiratory failure with hypoxia (HCC)   Chronic atrial fibrillation with rapid ventricular response   Acute tubular necrosis (HCC)   Cardiac arrest (HCC)   Obstructive sleep apnea   1. Acute on chronic respiratory failure with hypoxia we will continue with T collar trials titrate oxygen as tolerated continue pulmonary toilet.  Tolerating PMV 2. Chronic atrial fibrillation rate controlled we will continue to monitor 3. Acute tubular necrosis at baseline continue supportive care 4. Cardiac arrest rhythm is stable 5. OSA tracheostomy remains in place   I have personally seen and evaluated the patient, evaluated laboratory and imaging results, formulated the assessment and plan and placed orders. The Patient requires high complexity decision making for assessment and support.  Case was discussed on Rounds with the Respiratory Therapy Staff  Yevonne Pax, MD [facility] Pulmonary Critical Care Medicine Sleep Medicine

## 2018-07-23 DIAGNOSIS — I469 Cardiac arrest, cause unspecified: Secondary | ICD-10-CM | POA: Diagnosis not present

## 2018-07-23 DIAGNOSIS — J9621 Acute and chronic respiratory failure with hypoxia: Secondary | ICD-10-CM | POA: Diagnosis not present

## 2018-07-23 DIAGNOSIS — I482 Chronic atrial fibrillation, unspecified: Secondary | ICD-10-CM | POA: Diagnosis not present

## 2018-07-23 DIAGNOSIS — N170 Acute kidney failure with tubular necrosis: Secondary | ICD-10-CM | POA: Diagnosis not present

## 2018-07-23 NOTE — Progress Notes (Addendum)
Pulmonary Critical Care Medicine [facility] [place]   PULMONARY CRITICAL CARE SERVICE  PROGRESS NOTE  Date of Service: 07/23/2018  Cristina Norris  [id number]  DOB: Sep 07, 1965   DOA: 07/04/2018  Referring Physician: Carron Curie, MD  HPI: Cristina Norris is a 53 y.o. female seen for follow up of Acute on Chronic Respiratory Failure.  Patient is on trach collar FiO2 28%.  Patient is able to use PMV however unable to clear secretions.  Patient has moderate amount of secretions noted.  Medications: Reviewed on Rounds  Physical Exam:  Vitals: Pulse 84 respirations 14 BP 137/86 O2 sat 100% temp 99 3  Ventilator Settings patient's not currently on ventilator  . General: Comfortable at this time . Eyes: Grossly normal lids, irises & conjunctiva . ENT: grossly tongue is normal . Neck: no obvious mass . Cardiovascular: S1 S2 normal no gallop . Respiratory: Coarse breath sounds . Abdomen: soft . Skin: no rash seen on limited exam . Musculoskeletal: not rigid . Psychiatric:unable to assess . Neurologic: no seizure no involuntary movements         Lab Data:   Basic Metabolic Panel: Recent Labs  Lab 07/18/18 0635 07/19/18 0946  NA 142  --   K 3.1* 3.5  CL 100  --   CO2 29  --   GLUCOSE 152*  --   BUN 37*  --   CREATININE 1.01*  --   CALCIUM 8.3*  --   MG 1.4*  --   PHOS 4.9*  --     ABG: No results for input(s): PHART, PCO2ART, PO2ART, HCO3, O2SAT in the last 168 hours.  Liver Function Tests: Recent Labs  Lab 07/18/18 0635  ALBUMIN 1.9*   No results for input(s): LIPASE, AMYLASE in the last 168 hours. No results for input(s): AMMONIA in the last 168 hours.  CBC: Recent Labs  Lab 07/18/18 0635  WBC 9.5  HGB 7.3*  HCT 24.2*  MCV 89.6  PLT 335    Cardiac Enzymes: No results for input(s): CKTOTAL, CKMB, CKMBINDEX, TROPONINI in the last 168 hours.  BNP (last 3 results) Recent Labs    07/06/18 0628  BNP 1,109.4*    ProBNP (last 3  results) No results for input(s): PROBNP in the last 8760 hours.  Radiological Exams: No results found.  Assessment/Plan Active Problems:   Acute on chronic respiratory failure with hypoxia (HCC)   Chronic atrial fibrillation with rapid ventricular response   Acute tubular necrosis (HCC)   Cardiac arrest (HCC)   Obstructive sleep apnea   1. Acute on chronic respiratory failure hypoxia continue trach collar trials and titrate oxygen as tolerated.  Continue aggressive pulmonary toilet.  Patient is able to tolerate PMV however unable to clear secretions. 2. Chronic atrial fibrillation rate controlled continue to monitor 3. Acute tubular process at baseline continue supportive care 4. Cardiac arrest rhythm stable 5. 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 The Patient requires high complexity decision making for assessment and support.  Case was discussed on Rounds with the Respiratory Therapy Staff  Yevonne Pax, MD [facility] Pulmonary Critical Care Medicine Sleep Medicine

## 2018-07-24 DIAGNOSIS — I469 Cardiac arrest, cause unspecified: Secondary | ICD-10-CM | POA: Diagnosis not present

## 2018-07-24 DIAGNOSIS — N170 Acute kidney failure with tubular necrosis: Secondary | ICD-10-CM | POA: Diagnosis not present

## 2018-07-24 DIAGNOSIS — I482 Chronic atrial fibrillation, unspecified: Secondary | ICD-10-CM | POA: Diagnosis not present

## 2018-07-24 DIAGNOSIS — J9621 Acute and chronic respiratory failure with hypoxia: Secondary | ICD-10-CM | POA: Diagnosis not present

## 2018-07-24 NOTE — Progress Notes (Addendum)
Pulmonary Critical Care Medicine [facility] [place]   PULMONARY CRITICAL CARE SERVICE  PROGRESS NOTE  Date of Service: 07/24/2018  Cristina Norris  [id number]  DOB: 04/27/1966   DOA: 07/04/2018  Referring Physician: Carron Curie, MD  HPI: Cristina Norris is a 53 y.o. female seen for follow up of Acute on Chronic Respiratory Failure.  Patient remains on trach collar 28% FiO2.  Using PMV and eating without difficulty.  Mild to moderate amount of secretions noted.  Medications: Reviewed on Rounds  Physical Exam:  Vitals: Pulse 66 respirations 20 BP 142/87 O2 sat 99% temp 98.7  Ventilator Settings patient is not currently on ventilator  . General: Comfortable at this time . Eyes: Grossly normal lids, irises & conjunctiva . ENT: grossly tongue is normal . Neck: no obvious mass . Cardiovascular: S1 S2 normal no gallop . Respiratory: Coarse breath sounds . Abdomen: soft . Skin: no rash seen on limited exam . Musculoskeletal: not rigid . Psychiatric:unable to assess . Neurologic: no seizure no involuntary movements         Lab Data:   Basic Metabolic Panel: Recent Labs  Lab 07/18/18 0635 07/19/18 0946  NA 142  --   K 3.1* 3.5  CL 100  --   CO2 29  --   GLUCOSE 152*  --   BUN 37*  --   CREATININE 1.01*  --   CALCIUM 8.3*  --   MG 1.4*  --   PHOS 4.9*  --     ABG: No results for input(s): PHART, PCO2ART, PO2ART, HCO3, O2SAT in the last 168 hours.  Liver Function Tests: Recent Labs  Lab 07/18/18 0635  ALBUMIN 1.9*   No results for input(s): LIPASE, AMYLASE in the last 168 hours. No results for input(s): AMMONIA in the last 168 hours.  CBC: Recent Labs  Lab 07/18/18 0635  WBC 9.5  HGB 7.3*  HCT 24.2*  MCV 89.6  PLT 335    Cardiac Enzymes: No results for input(s): CKTOTAL, CKMB, CKMBINDEX, TROPONINI in the last 168 hours.  BNP (last 3 results) Recent Labs    07/06/18 0628  BNP 1,109.4*    ProBNP (last 3 results) No results for  input(s): PROBNP in the last 8760 hours.  Radiological Exams: No results found.  Assessment/Plan Active Problems:   Acute on chronic respiratory failure with hypoxia (HCC)   Chronic atrial fibrillation with rapid ventricular response   Acute tubular necrosis (HCC)   Cardiac arrest (HCC)   Obstructive sleep apnea   1. Acute on chronic respiratory failure with hypoxia continue trach collar trials and titrate oxygen as tolerated.  Continue aggressive pulmonary toilet.  Continue using PMV as tolerated. 2. Chronic atrial fibrillation rate controlled continue to monitor 3. Acute tubular necrosis at baseline continue supportive care 4. Cardiac arrest rhythm stable 5. 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 The Patient requires high complexity decision making for assessment and support.  Case was discussed on Rounds with the Respiratory Therapy Staff  Yevonne Pax, MD [facility] Pulmonary Critical Care Medicine Sleep Medicine

## 2018-07-25 DIAGNOSIS — I469 Cardiac arrest, cause unspecified: Secondary | ICD-10-CM | POA: Diagnosis not present

## 2018-07-25 DIAGNOSIS — N170 Acute kidney failure with tubular necrosis: Secondary | ICD-10-CM | POA: Diagnosis not present

## 2018-07-25 DIAGNOSIS — I482 Chronic atrial fibrillation, unspecified: Secondary | ICD-10-CM | POA: Diagnosis not present

## 2018-07-25 DIAGNOSIS — J9621 Acute and chronic respiratory failure with hypoxia: Secondary | ICD-10-CM | POA: Diagnosis not present

## 2018-07-25 LAB — DIGOXIN LEVEL: Digoxin Level: 0.9 ng/mL (ref 0.8–2.0)

## 2018-07-25 NOTE — Progress Notes (Signed)
Pulmonary Critical Care Medicine [facility] [place]   PULMONARY CRITICAL CARE SERVICE  PROGRESS NOTE  Date of Service: 07/25/2018  Cristina Norris  [id number]  DOB: 12-Aug-1965   DOA: 07/04/2018  Referring Physician: Carron Curie, MD  HPI: Cristina Norris is a 53 y.o. female seen for follow up of Acute on Chronic Respiratory Failure.  Patient is on T collar using the PMV looks good  Medications: Reviewed on Rounds  Physical Exam:  Vitals: Temperature 98.4 pulse 62 respiratory rate 19 blood pressure 144/82 saturations 97%  Ventilator Settings off the ventilator on T collar with the PMV in place  . General: Comfortable at this time . Eyes: Grossly normal lids, irises & conjunctiva . ENT: grossly tongue is normal . Neck: no obvious mass . Cardiovascular: S1 S2 normal no gallop . Respiratory: No rhonchi or rales are noted at this time . Abdomen: soft . Skin: no rash seen on limited exam . Musculoskeletal: not rigid . Psychiatric:unable to assess . Neurologic: no seizure no involuntary movements         Lab Data:   Basic Metabolic Panel: Recent Labs  Lab 07/19/18 0946  K 3.5    ABG: No results for input(s): PHART, PCO2ART, PO2ART, HCO3, O2SAT in the last 168 hours.  Liver Function Tests: No results for input(s): AST, ALT, ALKPHOS, BILITOT, PROT, ALBUMIN in the last 168 hours. No results for input(s): LIPASE, AMYLASE in the last 168 hours. No results for input(s): AMMONIA in the last 168 hours.  CBC: No results for input(s): WBC, NEUTROABS, HGB, HCT, MCV, PLT in the last 168 hours.  Cardiac Enzymes: No results for input(s): CKTOTAL, CKMB, CKMBINDEX, TROPONINI in the last 168 hours.  BNP (last 3 results) Recent Labs    07/06/18 0628  BNP 1,109.4*    ProBNP (last 3 results) No results for input(s): PROBNP in the last 8760 hours.  Radiological Exams: No results found.  Assessment/Plan Active Problems:   Acute on chronic respiratory  failure with hypoxia (HCC)   Chronic atrial fibrillation with rapid ventricular response   Acute tubular necrosis (HCC)   Cardiac arrest (HCC)   Obstructive sleep apnea   1. Acute on chronic respiratory failure hypoxia we will continue with T collar and PMV titrate oxygen continue pulmonary toilet secretion management. 2. Chronic atrial fibrillation rate controlled at this time we will continue with supportive care 3. Acute tubular necrosis labs are improved we will continue to monitor 4. Cardiac arrest rhythm is stable right now 5. Obstructive sleep apnea has tracheostomy in place we will continue with supportive care   I have personally seen and evaluated the patient, evaluated laboratory and imaging results, formulated the assessment and plan and placed orders. The Patient requires high complexity decision making for assessment and support.  Case was discussed on Rounds with the Respiratory Therapy Staff  Yevonne Pax, MD [facility] Pulmonary Critical Care Medicine Sleep Medicine

## 2018-07-26 DIAGNOSIS — I469 Cardiac arrest, cause unspecified: Secondary | ICD-10-CM | POA: Diagnosis not present

## 2018-07-26 DIAGNOSIS — I482 Chronic atrial fibrillation, unspecified: Secondary | ICD-10-CM | POA: Diagnosis not present

## 2018-07-26 DIAGNOSIS — N170 Acute kidney failure with tubular necrosis: Secondary | ICD-10-CM | POA: Diagnosis not present

## 2018-07-26 DIAGNOSIS — J9621 Acute and chronic respiratory failure with hypoxia: Secondary | ICD-10-CM | POA: Diagnosis not present

## 2018-07-26 LAB — CBC
HCT: 24.6 % — ABNORMAL LOW (ref 36.0–46.0)
Hemoglobin: 7.6 g/dL — ABNORMAL LOW (ref 12.0–15.0)
MCH: 28.3 pg (ref 26.0–34.0)
MCHC: 30.9 g/dL (ref 30.0–36.0)
MCV: 91.4 fL (ref 80.0–100.0)
Platelets: 511 10*3/uL — ABNORMAL HIGH (ref 150–400)
RBC: 2.69 MIL/uL — ABNORMAL LOW (ref 3.87–5.11)
RDW: 17.5 % — ABNORMAL HIGH (ref 11.5–15.5)
WBC: 6.8 10*3/uL (ref 4.0–10.5)
nRBC: 0.4 % — ABNORMAL HIGH (ref 0.0–0.2)

## 2018-07-26 LAB — BASIC METABOLIC PANEL
Anion gap: 11 (ref 5–15)
BUN: 27 mg/dL — ABNORMAL HIGH (ref 6–20)
CO2: 31 mmol/L (ref 22–32)
Calcium: 8.2 mg/dL — ABNORMAL LOW (ref 8.9–10.3)
Chloride: 99 mmol/L (ref 98–111)
Creatinine, Ser: 1.05 mg/dL — ABNORMAL HIGH (ref 0.44–1.00)
GFR calc Af Amer: >60 mL/min (ref >60)
GFR calc non Af Amer: >60 mL/min (ref >60)
Glucose, Bld: 97 mg/dL (ref 70–99)
Potassium: 3.9 mmol/L (ref 3.5–5.1)
Sodium: 141 mmol/L (ref 135–145)

## 2018-07-26 LAB — C DIFFICILE QUICK SCREEN W PCR REFLEX
C Diff antigen: NEGATIVE
C Diff interpretation: NOT DETECTED
C Diff toxin: NEGATIVE

## 2018-07-26 LAB — MAGNESIUM: Magnesium: 1.6 mg/dL — ABNORMAL LOW (ref 1.7–2.4)

## 2018-07-26 NOTE — Progress Notes (Signed)
Pulmonary Critical Care Medicine [facility] [place]   PULMONARY CRITICAL CARE SERVICE  PROGRESS NOTE  Date of Service: 07/26/2018  Cristina Norris  [id number]:[id number]  DOB: [birth year]   DOA: 07/04/2018  Referring Physician: Carron Curie, MD  HPI: Cristina Norris is a 53 y.o. female seen for follow up of Acute on Chronic Respiratory Failure.  Patient is on T collar wean today seems to be tolerating it well  Medications: Reviewed on Rounds  Physical Exam:  Vitals: Temperature 98.3 pulse 64 respiratory rate 19 blood pressure 126/76 saturations 98%  Ventilator Settings currently on T collar FiO2 28%  . General: Comfortable at this time . Eyes: Grossly normal lids, irises & conjunctiva . ENT: grossly tongue is normal . Neck: no obvious mass . Cardiovascular: S1 S2 normal no gallop . Respiratory: No rhonchi or rales are noted at this time . Abdomen: soft . Skin: no rash seen on limited exam . Musculoskeletal: not rigid . Psychiatric:unable to assess . Neurologic: no seizure no involuntary movements         Lab Data:   Basic Metabolic Panel: Recent Labs  Lab 07/26/18 0522  NA 141  K 3.9  CL 99  CO2 31  GLUCOSE 97  BUN 27*  CREATININE 1.05*  CALCIUM 8.2*  MG 1.6*    ABG: No results for input(s): PHART, PCO2ART, PO2ART, HCO3, O2SAT in the last 168 hours.  Liver Function Tests: No results for input(s): AST, ALT, ALKPHOS, BILITOT, PROT, ALBUMIN in the last 168 hours. No results for input(s): LIPASE, AMYLASE in the last 168 hours. No results for input(s): AMMONIA in the last 168 hours.  CBC: Recent Labs  Lab 07/26/18 0522  WBC 6.8  HGB 7.6*  HCT 24.6*  MCV 91.4  PLT 511*    Cardiac Enzymes: No results for input(s): CKTOTAL, CKMB, CKMBINDEX, TROPONINI in the last 168 hours.  BNP (last 3 results) Recent Labs    07/06/18 0628  BNP 1,109.4*    ProBNP (last 3 results) No results for input(s): PROBNP in the last 8760 hours.  Radiological  Exams: No results found.  Assessment/Plan Active Problems:   Acute on chronic respiratory failure with hypoxia (HCC)   Chronic atrial fibrillation with rapid ventricular response   Acute tubular necrosis (HCC)   Cardiac arrest (HCC)   Obstructive sleep apnea   1. Acute on chronic respiratory failure with hypoxia doing well with only mild right now is got the PMV in place which patient is tolerating continue to wean as tolerated 2. Chronic atrial fibrillation rate better controlled cardiology following 3. Acute tubular necrosis following labs 4. Cardiac arrest rhythm is stable 5. Sleep apnea nonissue at this time   I have personally seen and evaluated the patient, evaluated laboratory and imaging results, formulated the assessment and plan and placed orders. The Patient requires high complexity decision making for assessment and support.  Case was discussed on Rounds with the Respiratory Therapy Staff  Yevonne Pax, MD [facility] Pulmonary Critical Care Medicine Sleep Medicine

## 2018-07-26 NOTE — Consult Note (Signed)
Ref: Default, Provider, MD   Subjective:  Heart rate in 50-60's at times. She is in sinus rhythm. She has some cough. Hgb 7.6. BMET is stable.  Objective:  Vital Signs in the last 24 hours:  P: 50-80's. R-14.   Physical Exam: BP Readings from Last 1 Encounters:  No data found for BP     Wt Readings from Last 1 Encounters:  No data found for Wt    Weight change:  There is no height or weight on file to calculate BMI. HEENT: [place]/AT, Eyes-Brown, Conjunctiva-Pale, Sclera-Non-icteric Neck: No JVD, No bruit, Trachea midline. Lungs:  Clearing, Bilateral. Cardiac:  Regular rhythm, normal S1 and S2, no S3. II/VI systolic murmur. Abdomen:  Soft, non-tender. BS present. Extremities:  Trace edema present. No cyanosis. No clubbing. CNS: AxOx2, Cranial nerves grossly intact, moves all 4 extremities.  Skin: Warm and dry.   Intake/Output from previous day: No intake/output data recorded.    Lab Results: BMET    Component Value Date/Time   NA 141 07/26/2018 0522   NA 142 07/18/2018 0635   NA 142 07/15/2018 0706   K 3.9 07/26/2018 0522   K 3.5 07/19/2018 0946   K 3.1 (L) 07/18/2018 0635   CL 99 07/26/2018 0522   CL 100 07/18/2018 0635   CL 103 07/15/2018 0706   CO2 31 07/26/2018 0522   CO2 29 07/18/2018 0635   CO2 27 07/15/2018 0706   GLUCOSE 97 07/26/2018 0522   GLUCOSE 152 (H) 07/18/2018 0635   GLUCOSE 137 (H) 07/15/2018 0706   BUN 27 (H) 07/26/2018 0522   BUN 37 (H) 07/18/2018 0635   BUN 24 (H) 07/15/2018 0706   CREATININE 1.05 (H) 07/26/2018 0522   CREATININE 1.01 (H) 07/18/2018 0635   CREATININE 0.90 07/15/2018 0706   CALCIUM 8.2 (L) 07/26/2018 0522   CALCIUM 8.3 (L) 07/18/2018 0635   CALCIUM 8.7 (L) 07/15/2018 0706   GFRNONAA >60 07/26/2018 0522   GFRNONAA >60 07/18/2018 0635   GFRNONAA >60 07/15/2018 0706   GFRAA >60 07/26/2018 0522   GFRAA >60 07/18/2018 0635   GFRAA >60 07/15/2018 0706   CBC    Component Value Date/Time   WBC 6.8 07/26/2018 0522   RBC 2.69  (L) 07/26/2018 0522   HGB 7.6 (L) 07/26/2018 0522   HCT 24.6 (L) 07/26/2018 0522   PLT 511 (H) 07/26/2018 0522   MCV 91.4 07/26/2018 0522   MCH 28.3 07/26/2018 0522   MCHC 30.9 07/26/2018 0522   RDW 17.5 (H) 07/26/2018 0522   LYMPHSABS 1.2 07/05/2018 0555   MONOABS 0.6 07/05/2018 0555   EOSABS 0.0 07/05/2018 0555   BASOSABS 0.0 07/05/2018 0555   HEPATIC Function Panel Recent Labs    07/05/18 0555  PROT 7.0   HEMOGLOBIN A1C No components found for: HGA1C,  MPG CARDIAC ENZYMES No results found for: CKTOTAL, CKMB, CKMBINDEX, TROPONINI BNP No results for input(s): PROBNP in the last 8760 hours. TSH Recent Labs    07/05/18 0555  TSH 0.382   CHOLESTEROL No results for input(s): CHOL in the last 8760 hours.  Scheduled Meds: Continuous Infusions: PRN Meds:.  Assessment/Plan: Atrial fibrillation, paroxysmal Sinus rhythm with episodes of asymptomatic bradycardia. Acute on chronic respiratory failure with hypoxemia, improving Type 2 DM Morbid obesity Pneumonia  Decrease clonidine and diltiazem use to reduce bradycardia episodes. Hold B-blocker and or Diltiazem if HR 60-65 bpm.   LOS: 0 days    Orpah CobbAjay Genecis Veley  MD  07/26/2018, 1:33 PM

## 2018-07-27 DIAGNOSIS — N170 Acute kidney failure with tubular necrosis: Secondary | ICD-10-CM | POA: Diagnosis not present

## 2018-07-27 DIAGNOSIS — I469 Cardiac arrest, cause unspecified: Secondary | ICD-10-CM | POA: Diagnosis not present

## 2018-07-27 DIAGNOSIS — J9621 Acute and chronic respiratory failure with hypoxia: Secondary | ICD-10-CM | POA: Diagnosis not present

## 2018-07-27 DIAGNOSIS — I482 Chronic atrial fibrillation, unspecified: Secondary | ICD-10-CM | POA: Diagnosis not present

## 2018-07-27 LAB — MAGNESIUM: Magnesium: 1.8 mg/dL (ref 1.7–2.4)

## 2018-07-27 NOTE — Progress Notes (Addendum)
Pulmonary Critical Care Medicine [facility] [place]   PULMONARY CRITICAL CARE SERVICE  PROGRESS NOTE  Date of Service: 07/27/2018  Cristina Norris  PQZ:[id number]  DOB: May 22, 1966   DOA: 07/04/2018  Referring Physician: Carron Curie, MD  HPI: Cristina Norris is a 53 y.o. female seen for follow up of Acute on Chronic Respiratory Failure.  Patient continues on 28% FiO2 via trach collar.  Doing well this time using PMV with minimal secretions.  Medications: Reviewed on Rounds  Physical Exam:  Vitals: Pulse 9 respirations 16 BP 146/83 O2 sat 98% temp 98.6  Ventilator Settings patient's not currently on ventilator  . General: Comfortable at this time . Eyes: Grossly normal lids, irises & conjunctiva . ENT: grossly tongue is normal . Neck: no obvious mass . Cardiovascular: S1 S2 normal no gallop . Respiratory: No rales or rhonchi noted . Abdomen: soft . Skin: no rash seen on limited exam . Musculoskeletal: not rigid . Psychiatric:unable to assess . Neurologic: no seizure no involuntary movements         Lab Data:   Basic Metabolic Panel: Recent Labs  Lab 07/26/18 0522 07/27/18 0453  NA 141  --   K 3.9  --   CL 99  --   CO2 31  --   GLUCOSE 97  --   BUN 27*  --   CREATININE 1.05*  --   CALCIUM 8.2*  --   MG 1.6* 1.8    ABG: No results for input(s): PHART, PCO2ART, PO2ART, HCO3, O2SAT in the last 168 hours.  Liver Function Tests: No results for input(s): AST, ALT, ALKPHOS, BILITOT, PROT, ALBUMIN in the last 168 hours. No results for input(s): LIPASE, AMYLASE in the last 168 hours. No results for input(s): AMMONIA in the last 168 hours.  CBC: Recent Labs  Lab 07/26/18 0522  WBC 6.8  HGB 7.6*  HCT 24.6*  MCV 91.4  PLT 511*    Cardiac Enzymes: No results for input(s): CKTOTAL, CKMB, CKMBINDEX, TROPONINI in the last 168 hours.  BNP (last 3 results) Recent Labs    07/06/18 0628  BNP 1,109.4*    ProBNP (last 3 results) No results for  input(s): PROBNP in the last 8760 hours.  Radiological Exams: No results found.  Assessment/Plan Active Problems:   Acute on chronic respiratory failure with hypoxia (HCC)   Chronic atrial fibrillation with rapid ventricular response   Acute tubular necrosis (HCC)   Cardiac arrest (HCC)   Obstructive sleep apnea   1. Acute on chronic respiratory with hypoxia doing well at this time using PMV.  Continue to progress via protocol as tolerated. 2. Chronic atrial fibrillation rate controlled cardiology is following 3. Acute to necrosis following labs 4. Cardiac arrest rhythm stable 5. Deep apnea nonissue at this time   I have personally seen and evaluated the patient, evaluated laboratory and imaging results, formulated the assessment and plan and placed orders. The Patient requires high complexity decision making for assessment and support.  Case was discussed on Rounds with the Respiratory Therapy Staff  Yevonne Pax, MD [facility] Pulmonary Critical Care Medicine Sleep Medicine

## 2018-07-28 DIAGNOSIS — J9621 Acute and chronic respiratory failure with hypoxia: Secondary | ICD-10-CM | POA: Diagnosis not present

## 2018-07-28 DIAGNOSIS — I482 Chronic atrial fibrillation, unspecified: Secondary | ICD-10-CM | POA: Diagnosis not present

## 2018-07-28 DIAGNOSIS — I469 Cardiac arrest, cause unspecified: Secondary | ICD-10-CM | POA: Diagnosis not present

## 2018-07-28 DIAGNOSIS — N170 Acute kidney failure with tubular necrosis: Secondary | ICD-10-CM | POA: Diagnosis not present

## 2018-07-28 NOTE — Progress Notes (Addendum)
Pulmonary Critical Care Medicine [facility] [place]   PULMONARY CRITICAL CARE SERVICE  PROGRESS NOTE  Date of Service: 07/28/2018  Cristina Norris  [id number]:[id number]  DOB: Nov 11, 1965   DOA: 07/04/2018  Referring Physician: Carron Curie, MD  HPI: Cristina Norris is a 53 y.o. female seen for follow up of Acute on Chronic Respiratory Failure.  Patient continues to do well 28% FiO2 trach collar.  Today she is using her PMV without difficulty and having minimal secretions.  Medications: Reviewed on Rounds  Physical Exam:  Vitals: Pulse 59 respirations 16 BP 146/83 O2 sat 98% temp 98.6  Ventilator Settings patient's not currently on ventilator  . General: Comfortable at this time . Eyes: Grossly normal lids, irises & conjunctiva . ENT: grossly tongue is normal . Neck: no obvious mass . Cardiovascular: S1 S2 normal no gallop . Respiratory: No rales or rhonchi noted . Abdomen: soft . Skin: no rash seen on limited exam . Musculoskeletal: not rigid . Psychiatric:unable to assess . Neurologic: no seizure no involuntary movements         Lab Data:   Basic Metabolic Panel: Recent Labs  Lab 07/26/18 0522 07/27/18 0453  NA 141  --   K 3.9  --   CL 99  --   CO2 31  --   GLUCOSE 97  --   BUN 27*  --   CREATININE 1.05*  --   CALCIUM 8.2*  --   MG 1.6* 1.8    ABG: No results for input(s): PHART, PCO2ART, PO2ART, HCO3, O2SAT in the last 168 hours.  Liver Function Tests: No results for input(s): AST, ALT, ALKPHOS, BILITOT, PROT, ALBUMIN in the last 168 hours. No results for input(s): LIPASE, AMYLASE in the last 168 hours. No results for input(s): AMMONIA in the last 168 hours.  CBC: Recent Labs  Lab 07/26/18 0522  WBC 6.8  HGB 7.6*  HCT 24.6*  MCV 91.4  PLT 511*    Cardiac Enzymes: No results for input(s): CKTOTAL, CKMB, CKMBINDEX, TROPONINI in the last 168 hours.  BNP (last 3 results) Recent Labs    07/06/18 0628  BNP 1,109.4*    ProBNP (last 3  results) No results for input(s): PROBNP in the last 8760 hours.  Radiological Exams: No results found.  Assessment/Plan Active Problems:   Acute on chronic respiratory failure with hypoxia (HCC)   Chronic atrial fibrillation with rapid ventricular response   Acute tubular necrosis (HCC)   Cardiac arrest (HCC)   Obstructive sleep apnea   1. Acute on chronic respiratory failure with hypoxia patient is using PMV without difficulty.  Continue to progress via protocol with T collar trials at this time. 2. Chronic atrial fibrillation rate controlled cardiology following 3. Acute tubular necrosis following labs 4. Cardiac arrest rhythm stable 5. Sleep apnea nonissue at this time   I have personally seen and evaluated the patient, evaluated laboratory and imaging results, formulated the assessment and plan and placed orders. The Patient requires high complexity decision making for assessment and support.  Case was discussed on Rounds with the Respiratory Therapy Staff  Yevonne Pax, MD [facility] Pulmonary Critical Care Medicine Sleep Medicine

## 2018-07-29 DIAGNOSIS — J9621 Acute and chronic respiratory failure with hypoxia: Secondary | ICD-10-CM | POA: Diagnosis not present

## 2018-07-29 DIAGNOSIS — N170 Acute kidney failure with tubular necrosis: Secondary | ICD-10-CM | POA: Diagnosis not present

## 2018-07-29 DIAGNOSIS — I469 Cardiac arrest, cause unspecified: Secondary | ICD-10-CM | POA: Diagnosis not present

## 2018-07-29 DIAGNOSIS — I482 Chronic atrial fibrillation, unspecified: Secondary | ICD-10-CM | POA: Diagnosis not present

## 2018-07-29 LAB — BASIC METABOLIC PANEL
Anion gap: 10 (ref 5–15)
BUN: 28 mg/dL — ABNORMAL HIGH (ref 6–20)
CO2: 31 mmol/L (ref 22–32)
Calcium: 8.3 mg/dL — ABNORMAL LOW (ref 8.9–10.3)
Chloride: 100 mmol/L (ref 98–111)
Creatinine, Ser: 1.15 mg/dL — ABNORMAL HIGH (ref 0.44–1.00)
GFR calc Af Amer: >60 mL/min (ref >60)
GFR calc non Af Amer: 54 mL/min — ABNORMAL LOW (ref >60)
Glucose, Bld: 100 mg/dL — ABNORMAL HIGH (ref 70–99)
Potassium: 3.7 mmol/L (ref 3.5–5.1)
Sodium: 141 mmol/L (ref 135–145)

## 2018-07-29 LAB — MAGNESIUM: Magnesium: 1.7 mg/dL (ref 1.7–2.4)

## 2018-07-29 NOTE — Progress Notes (Addendum)
Pulmonary Critical Care Medicine [facility] [place]   PULMONARY CRITICAL CARE SERVICE  PROGRESS NOTE  Date of Service: 07/29/2018  Cristina Norris  [id number]:[id number]  DOB: [birth year]   DOA: 07/04/2018  Referring Physician: Carron Curie, MD  HPI: Cristina Norris is a 53 y.o. female seen for follow up of Acute on Chronic Respiratory Failure.  Patient continues do well on trach collar 28% FiO2.  Using PMV during the day.  Patient was intended to be discharged home today however her equipment was not delivered in a timely fashion so discharge is held at this time.  Medications: Reviewed on Rounds  Physical Exam:  Vitals: Pulse 64 respirations 20 BP 132/72 O2 sat 98% temp 98.2  Ventilator Settings patient not currently on ventilator  . General: Comfortable at this time . Eyes: Grossly normal lids, irises & conjunctiva . ENT: grossly tongue is normal . Neck: no obvious mass . Cardiovascular: S1 S2 normal no gallop . Respiratory: No wheezes rhonchi noted . Abdomen: soft . Skin: no rash seen on limited exam . Musculoskeletal: not rigid . Psychiatric:unable to assess . Neurologic: no seizure no involuntary movements         Lab Data:   Basic Metabolic Panel: Recent Labs  Lab 07/26/18 0522 07/27/18 0453 07/29/18 0423  NA 141  --  141  K 3.9  --  3.7  CL 99  --  100  CO2 31  --  31  GLUCOSE 97  --  100*  BUN 27*  --  28*  CREATININE 1.05*  --  1.15*  CALCIUM 8.2*  --  8.3*  MG 1.6* 1.8 1.7    ABG: No results for input(s): PHART, PCO2ART, PO2ART, HCO3, O2SAT in the last 168 hours.  Liver Function Tests: No results for input(s): AST, ALT, ALKPHOS, BILITOT, PROT, ALBUMIN in the last 168 hours. No results for input(s): LIPASE, AMYLASE in the last 168 hours. No results for input(s): AMMONIA in the last 168 hours.  CBC: Recent Labs  Lab 07/26/18 0522  WBC 6.8  HGB 7.6*  HCT 24.6*  MCV 91.4  PLT 511*    Cardiac Enzymes: No results for input(s):  CKTOTAL, CKMB, CKMBINDEX, TROPONINI in the last 168 hours.  BNP (last 3 results) Recent Labs    07/06/18 0628  BNP 1,109.4*    ProBNP (last 3 results) No results for input(s): PROBNP in the last 8760 hours.  Radiological Exams: No results found.  Assessment/Plan Active Problems:   Acute on chronic respiratory failure with hypoxia (HCC)   Chronic atrial fibrillation with rapid ventricular response   Acute tubular necrosis (HCC)   Cardiac arrest (HCC)   Obstructive sleep apnea   1. Acute on chronic respiratory failure with hypoxia patient using PMV without difficulty.  Continue with T collar at 28%. 2. Chronic atrial fibrillation rate controlled cardiology is following. 3. Acute tubular necrosis following labs 4. Cardiac arrest rhythm stable 5. Sleep apnea nonissue   I have personally seen and evaluated the patient, evaluated laboratory and imaging results, formulated the assessment and plan and placed orders. The Patient requires high complexity decision making for assessment and support.  Case was discussed on Rounds with the Respiratory Therapy Staff  Yevonne Pax, MD [facility] Pulmonary Critical Care Medicine Sleep Medicine

## 2018-07-30 DIAGNOSIS — J9621 Acute and chronic respiratory failure with hypoxia: Secondary | ICD-10-CM | POA: Diagnosis not present

## 2018-07-30 DIAGNOSIS — I482 Chronic atrial fibrillation, unspecified: Secondary | ICD-10-CM | POA: Diagnosis not present

## 2018-07-30 DIAGNOSIS — I469 Cardiac arrest, cause unspecified: Secondary | ICD-10-CM | POA: Diagnosis not present

## 2018-07-30 DIAGNOSIS — N170 Acute kidney failure with tubular necrosis: Secondary | ICD-10-CM | POA: Diagnosis not present

## 2018-07-30 LAB — MAGNESIUM: Magnesium: 1.9 mg/dL (ref 1.7–2.4)

## 2018-07-30 NOTE — Progress Notes (Addendum)
Pulmonary Critical Care Medicine [facility] [place]   PULMONARY CRITICAL CARE SERVICE  PROGRESS NOTE  Date of Service: 07/30/2018  Cristina Norris  YWV:[id number]  DOB: 1965-08-22   DOA: 07/04/2018  Referring Physician: Carron Curie, MD  HPI: Cristina Norris is a 53 y.o. female seen for follow up of Acute on Chronic Respiratory Failure.  Patient continues to do well using PMV.  She is also using trach collar with 28% FiO2.  Plan remains to DC patient to home Monday or Tuesday.  Medications: Reviewed on Rounds  Physical Exam:  Vitals: Pulse 65 respirations 18 BP 161/90 O2 sat 99% temp 98.1  Ventilator Settings patient's not on ventilator  . General: Comfortable at this time . Eyes: Grossly normal lids, irises & conjunctiva . ENT: grossly tongue is normal . Neck: no obvious mass . Cardiovascular: S1 S2 normal no gallop . Respiratory: No wheezes or rhonchi noted . Abdomen: soft . Skin: no rash seen on limited exam . Musculoskeletal: not rigid . Psychiatric:unable to assess . Neurologic: no seizure no involuntary movements         Lab Data:   Basic Metabolic Panel: Recent Labs  Lab 07/26/18 0522 07/27/18 0453 07/29/18 0423 07/30/18 0900  NA 141  --  141  --   K 3.9  --  3.7  --   CL 99  --  100  --   CO2 31  --  31  --   GLUCOSE 97  --  100*  --   BUN 27*  --  28*  --   CREATININE 1.05*  --  1.15*  --   CALCIUM 8.2*  --  8.3*  --   MG 1.6* 1.8 1.7 1.9    ABG: No results for input(s): PHART, PCO2ART, PO2ART, HCO3, O2SAT in the last 168 hours.  Liver Function Tests: No results for input(s): AST, ALT, ALKPHOS, BILITOT, PROT, ALBUMIN in the last 168 hours. No results for input(s): LIPASE, AMYLASE in the last 168 hours. No results for input(s): AMMONIA in the last 168 hours.  CBC: Recent Labs  Lab 07/26/18 0522  WBC 6.8  HGB 7.6*  HCT 24.6*  MCV 91.4  PLT 511*    Cardiac Enzymes: No results for input(s): CKTOTAL, CKMB, CKMBINDEX,  TROPONINI in the last 168 hours.  BNP (last 3 results) Recent Labs    07/06/18 0628  BNP 1,109.4*    ProBNP (last 3 results) No results for input(s): PROBNP in the last 8760 hours.  Radiological Exams: No results found.  Assessment/Plan Active Problems:   Acute on chronic respiratory failure with hypoxia (HCC)   Chronic atrial fibrillation with rapid ventricular response   Acute tubular necrosis (HCC)   Cardiac arrest (HCC)   Obstructive sleep apnea   1. Acute on chronic respiratory failure with hypoxia patient using PMV without difficulty.  Continue T collar 28%.  Patient is at baseline most likely be discharged home Monday or Tuesday. 2. Chronic atrial fibrillation rate controlled cardiology is following 3. Acute tubular necrosis follow labs 4. Cardiac arrest rhythm stable 5. Sleep apnea nonissue   I have personally seen and evaluated the patient, evaluated laboratory and imaging results, formulated the assessment and plan and placed orders. The Patient requires high complexity decision making for assessment and support.  Case was discussed on Rounds with the Respiratory Therapy Staff  Yevonne Pax, MD [facility] Pulmonary Critical Care Medicine Sleep Medicine

## 2018-07-31 DIAGNOSIS — I469 Cardiac arrest, cause unspecified: Secondary | ICD-10-CM | POA: Diagnosis not present

## 2018-07-31 DIAGNOSIS — J9621 Acute and chronic respiratory failure with hypoxia: Secondary | ICD-10-CM | POA: Diagnosis not present

## 2018-07-31 DIAGNOSIS — N170 Acute kidney failure with tubular necrosis: Secondary | ICD-10-CM | POA: Diagnosis not present

## 2018-07-31 DIAGNOSIS — I482 Chronic atrial fibrillation, unspecified: Secondary | ICD-10-CM | POA: Diagnosis not present

## 2018-07-31 LAB — CBC
HCT: 25.6 % — ABNORMAL LOW (ref 36.0–46.0)
Hemoglobin: 7.8 g/dL — ABNORMAL LOW (ref 12.0–15.0)
MCH: 28 pg (ref 26.0–34.0)
MCHC: 30.5 g/dL (ref 30.0–36.0)
MCV: 91.8 fL (ref 80.0–100.0)
Platelets: 497 10*3/uL — ABNORMAL HIGH (ref 150–400)
RBC: 2.79 MIL/uL — ABNORMAL LOW (ref 3.87–5.11)
RDW: 18.1 % — ABNORMAL HIGH (ref 11.5–15.5)
WBC: 8.2 10*3/uL (ref 4.0–10.5)
nRBC: 0 % (ref 0.0–0.2)

## 2018-07-31 LAB — BASIC METABOLIC PANEL
Anion gap: 5 (ref 5–15)
BUN: 25 mg/dL — ABNORMAL HIGH (ref 6–20)
CO2: 36 mmol/L — ABNORMAL HIGH (ref 22–32)
Calcium: 8.5 mg/dL — ABNORMAL LOW (ref 8.9–10.3)
Chloride: 98 mmol/L (ref 98–111)
Creatinine, Ser: 1.05 mg/dL — ABNORMAL HIGH (ref 0.44–1.00)
GFR calc Af Amer: >60 mL/min (ref >60)
GFR calc non Af Amer: >60 mL/min (ref >60)
Glucose, Bld: 110 mg/dL — ABNORMAL HIGH (ref 70–99)
Potassium: 3.7 mmol/L (ref 3.5–5.1)
Sodium: 139 mmol/L (ref 135–145)

## 2018-07-31 NOTE — Progress Notes (Addendum)
Pulmonary Critical Care Medicine [facility] [place]   PULMONARY CRITICAL CARE SERVICE  PROGRESS NOTE  Date of Service: 07/31/2018  Cristina Norris  YHC:[id number]  DOB: 01/17/1966   DOA: 07/04/2018  Referring Physician: Carron Curie, MD  HPI: Cristina Norris is a 53 y.o. female seen for follow up of Acute on Chronic Respiratory Failure.  Patient is doing very well with PMV and has minimal secretions.  Remains on trach collar 28% FiO2.  No acute distress is noted.  Medications: Reviewed on Rounds  Physical Exam:  Vitals: Pulse 68 respirations 20 BP 136/75 O2 sat 95% temp 98.6  Ventilator Settings patient's not on ventilator  . General: Comfortable at this time . Eyes: Grossly normal lids, irises & conjunctiva . ENT: grossly tongue is normal . Neck: no obvious mass . Cardiovascular: S1 S2 normal no gallop . Respiratory: No wheezes or rhonchi noted . Abdomen: soft . Skin: no rash seen on limited exam . Musculoskeletal: not rigid . Psychiatric:unable to assess . Neurologic: no seizure no involuntary movements         Lab Data:   Basic Metabolic Panel: Recent Labs  Lab 07/26/18 0522 07/27/18 0453 07/29/18 0423 07/30/18 0900 07/31/18 0625  NA 141  --  141  --  139  K 3.9  --  3.7  --  3.7  CL 99  --  100  --  98  CO2 31  --  31  --  36*  GLUCOSE 97  --  100*  --  110*  BUN 27*  --  28*  --  25*  CREATININE 1.05*  --  1.15*  --  1.05*  CALCIUM 8.2*  --  8.3*  --  8.5*  MG 1.6* 1.8 1.7 1.9  --     ABG: No results for input(s): PHART, PCO2ART, PO2ART, HCO3, O2SAT in the last 168 hours.  Liver Function Tests: No results for input(s): AST, ALT, ALKPHOS, BILITOT, PROT, ALBUMIN in the last 168 hours. No results for input(s): LIPASE, AMYLASE in the last 168 hours. No results for input(s): AMMONIA in the last 168 hours.  CBC: Recent Labs  Lab 07/26/18 0522 07/31/18 0625  WBC 6.8 8.2  HGB 7.6* 7.8*  HCT 24.6* 25.6*  MCV 91.4 91.8  PLT 511* 497*     Cardiac Enzymes: No results for input(s): CKTOTAL, CKMB, CKMBINDEX, TROPONINI in the last 168 hours.  BNP (last 3 results) Recent Labs    07/06/18 0628  BNP 1,109.4*    ProBNP (last 3 results) No results for input(s): PROBNP in the last 8760 hours.  Radiological Exams: No results found.  Assessment/Plan Active Problems:   Acute on chronic respiratory failure with hypoxia (HCC)   Chronic atrial fibrillation with rapid ventricular response   Acute tubular necrosis (HCC)   Cardiac arrest (HCC)   Obstructive sleep apnea   1. Acute on chronic respiratory failure with hypoxia patient using PMV without difficulty.  Continue T collar trials 28% FiO2.  Is likely at baseline and working towards discharge in the next few days. 2. Chronic atrial fibrillation rate controlled cardiology is following 3. Acute tubular necrosis follow labs 4. Cardiac arrest rhythm stable 5. Sleep apnea nonissue   I have personally seen and evaluated the patient, evaluated laboratory and imaging results, formulated the assessment and plan and placed orders. The Patient requires high complexity decision making for assessment and support.  Case was discussed on Rounds with the Respiratory Therapy Staff  Yevonne Pax, MD  [facility] Pulmonary Critical Care Medicine Sleep Medicine

## 2019-06-01 ENCOUNTER — Inpatient Hospital Stay
Admit: 2019-06-01 | Discharge: 2019-07-15 | Disposition: A | Discharge status: Skilled Nursing Facility | Payer: Medicare Other | Source: Other Acute Inpatient Hospital | Attending: Internal Medicine | Admitting: Internal Medicine

## 2019-06-01 DIAGNOSIS — Z431 Encounter for attention to gastrostomy: Secondary | ICD-10-CM

## 2019-06-01 DIAGNOSIS — I5022 Chronic systolic (congestive) heart failure: Secondary | ICD-10-CM | POA: Diagnosis present

## 2019-06-01 DIAGNOSIS — J9690 Respiratory failure, unspecified, unspecified whether with hypoxia or hypercapnia: Secondary | ICD-10-CM

## 2019-06-01 DIAGNOSIS — Z4659 Encounter for fitting and adjustment of other gastrointestinal appliance and device: Secondary | ICD-10-CM

## 2019-06-01 DIAGNOSIS — I509 Heart failure, unspecified: Secondary | ICD-10-CM

## 2019-06-01 DIAGNOSIS — I482 Chronic atrial fibrillation, unspecified: Secondary | ICD-10-CM | POA: Diagnosis present

## 2019-06-01 DIAGNOSIS — J9621 Acute and chronic respiratory failure with hypoxia: Secondary | ICD-10-CM | POA: Diagnosis present

## 2019-06-01 DIAGNOSIS — J189 Pneumonia, unspecified organism: Secondary | ICD-10-CM | POA: Diagnosis present

## 2019-06-01 DIAGNOSIS — N170 Acute kidney failure with tubular necrosis: Secondary | ICD-10-CM | POA: Diagnosis present

## 2019-06-01 DIAGNOSIS — R0902 Hypoxemia: Secondary | ICD-10-CM

## 2019-06-01 DIAGNOSIS — A419 Sepsis, unspecified organism: Secondary | ICD-10-CM | POA: Diagnosis present

## 2019-06-01 HISTORY — DX: Pneumonia, unspecified organism: J18.9

## 2019-06-01 HISTORY — DX: Chronic systolic (congestive) heart failure: I50.22

## 2019-06-01 HISTORY — DX: Sepsis, unspecified organism: A41.9

## 2019-06-01 HISTORY — DX: Sepsis, unspecified organism: R65.20

## 2019-06-01 LAB — BLOOD GAS, ARTERIAL
Acid-base deficit: 3.4 mmol/L — ABNORMAL HIGH (ref 0.0–2.0)
Bicarbonate: 20.4 mmol/L (ref 20.0–28.0)
FIO2: 30
O2 Saturation: 99.6 %
Patient temperature: 37
pCO2 arterial: 32.6 mmHg (ref 32.0–48.0)
pH, Arterial: 7.413 (ref 7.350–7.450)
pO2, Arterial: 139 mmHg — ABNORMAL HIGH (ref 83.0–108.0)

## 2019-06-02 ENCOUNTER — Other Ambulatory Visit (HOSPITAL_COMMUNITY): Payer: Medicare Other

## 2019-06-02 ENCOUNTER — Encounter: Payer: Self-pay | Admitting: Internal Medicine

## 2019-06-02 DIAGNOSIS — A419 Sepsis, unspecified organism: Secondary | ICD-10-CM | POA: Diagnosis present

## 2019-06-02 DIAGNOSIS — N170 Acute kidney failure with tubular necrosis: Secondary | ICD-10-CM | POA: Diagnosis not present

## 2019-06-02 DIAGNOSIS — R6520 Severe sepsis without septic shock: Secondary | ICD-10-CM

## 2019-06-02 DIAGNOSIS — J9621 Acute and chronic respiratory failure with hypoxia: Secondary | ICD-10-CM | POA: Diagnosis present

## 2019-06-02 DIAGNOSIS — I482 Chronic atrial fibrillation, unspecified: Secondary | ICD-10-CM | POA: Diagnosis not present

## 2019-06-02 DIAGNOSIS — J189 Pneumonia, unspecified organism: Secondary | ICD-10-CM | POA: Diagnosis not present

## 2019-06-02 LAB — COMPREHENSIVE METABOLIC PANEL
ALT: 19 U/L (ref 0–44)
AST: 21 U/L (ref 15–41)
Albumin: 1.4 g/dL — ABNORMAL LOW (ref 3.5–5.0)
Alkaline Phosphatase: 82 U/L (ref 38–126)
Anion gap: 12 (ref 5–15)
BUN: 79 mg/dL — ABNORMAL HIGH (ref 6–20)
CO2: 19 mmol/L — ABNORMAL LOW (ref 22–32)
Calcium: 7.7 mg/dL — ABNORMAL LOW (ref 8.9–10.3)
Chloride: 109 mmol/L (ref 98–111)
Creatinine, Ser: 2.52 mg/dL — ABNORMAL HIGH (ref 0.44–1.00)
GFR calc Af Amer: 24 mL/min — ABNORMAL LOW (ref >60)
GFR calc non Af Amer: 21 mL/min — ABNORMAL LOW (ref >60)
Glucose, Bld: 95 mg/dL (ref 70–99)
Potassium: 4.2 mmol/L (ref 3.5–5.1)
Sodium: 140 mmol/L (ref 135–145)
Total Bilirubin: 0.4 mg/dL (ref 0.3–1.2)
Total Protein: 5.9 g/dL — ABNORMAL LOW (ref 6.5–8.1)

## 2019-06-02 LAB — CBC
HCT: 24.8 % — ABNORMAL LOW (ref 36.0–46.0)
Hemoglobin: 8.2 g/dL — ABNORMAL LOW (ref 12.0–15.0)
MCH: 30 pg (ref 26.0–34.0)
MCHC: 33.1 g/dL (ref 30.0–36.0)
MCV: 90.8 fL (ref 80.0–100.0)
Platelets: 299 10*3/uL (ref 150–400)
RBC: 2.73 MIL/uL — ABNORMAL LOW (ref 3.87–5.11)
RDW: 17.1 % — ABNORMAL HIGH (ref 11.5–15.5)
WBC: 12.5 10*3/uL — ABNORMAL HIGH (ref 4.0–10.5)
nRBC: 1.2 % — ABNORMAL HIGH (ref 0.0–0.2)

## 2019-06-02 LAB — BLOOD GAS, ARTERIAL
Acid-base deficit: 2 mmol/L (ref 0.0–2.0)
Bicarbonate: 22.2 mmol/L (ref 20.0–28.0)
FIO2: 50
O2 Saturation: 99.1 %
Patient temperature: 37
pCO2 arterial: 37.3 mmHg (ref 32.0–48.0)
pH, Arterial: 7.392 (ref 7.350–7.450)
pO2, Arterial: 141 mmHg — ABNORMAL HIGH (ref 83.0–108.0)

## 2019-06-02 MED ORDER — LACOSAMIDE 10 MG/ML PO SOLN
100.00 | ORAL | Status: DC
Start: 2019-06-02 — End: 2019-06-02

## 2019-06-02 MED ORDER — INSULIN GLARGINE 100 UNIT/ML ~~LOC~~ SOLN
1.00 | SUBCUTANEOUS | Status: DC
Start: 2019-06-02 — End: 2019-06-02

## 2019-06-02 MED ORDER — GENERIC EXTERNAL MEDICATION
Status: DC
Start: ? — End: 2019-06-02

## 2019-06-02 MED ORDER — THIAMINE HCL 100 MG PO TABS
100.00 | ORAL_TABLET | ORAL | Status: DC
Start: 2019-06-02 — End: 2019-06-02

## 2019-06-02 MED ORDER — FENTANYL CITRATE (PF) 2500 MCG/50ML IJ SOLN
100.00 | INTRAMUSCULAR | Status: DC
Start: ? — End: 2019-06-02

## 2019-06-02 MED ORDER — GENERIC EXTERNAL MEDICATION
0.04 | Status: DC
Start: ? — End: 2019-06-02

## 2019-06-02 MED ORDER — HYDRALAZINE HCL 50 MG PO TABS
50.00 | ORAL_TABLET | ORAL | Status: DC
Start: 2019-06-02 — End: 2019-06-02

## 2019-06-02 MED ORDER — INSULIN LISPRO 100 UNIT/ML ~~LOC~~ SOLN
1.00 | SUBCUTANEOUS | Status: DC
Start: ? — End: 2019-06-02

## 2019-06-02 MED ORDER — ARFORMOTEROL TARTRATE 15 MCG/2ML IN NEBU
15.00 | INHALATION_SOLUTION | RESPIRATORY_TRACT | Status: DC
Start: 2019-06-02 — End: 2019-06-02

## 2019-06-02 MED ORDER — ACETAMINOPHEN 325 MG PO TABS
650.00 | ORAL_TABLET | ORAL | Status: DC
Start: ? — End: 2019-06-02

## 2019-06-02 MED ORDER — INSULIN LISPRO 100 UNIT/ML ~~LOC~~ SOLN
1.00 | SUBCUTANEOUS | Status: DC
Start: 2019-06-02 — End: 2019-06-02

## 2019-06-02 MED ORDER — INSULIN GLARGINE 100 UNIT/ML ~~LOC~~ SOLN
1.00 | SUBCUTANEOUS | Status: DC
Start: ? — End: 2019-06-02

## 2019-06-02 MED ORDER — GENERIC EXTERNAL MEDICATION
0.08 | Status: DC
Start: ? — End: 2019-06-02

## 2019-06-02 MED ORDER — FENTANYL CITRATE (PF) 2500 MCG/50ML IJ SOLN
25.00 | INTRAMUSCULAR | Status: DC
Start: ? — End: 2019-06-02

## 2019-06-02 MED ORDER — ACETAMINOPHEN 160 MG/5ML PO SOLN
650.00 | ORAL | Status: DC
Start: ? — End: 2019-06-02

## 2019-06-02 MED ORDER — GENERIC EXTERNAL MEDICATION
500.00 | Status: DC
Start: 2019-06-02 — End: 2019-06-02

## 2019-06-02 MED ORDER — FENTANYL CITRATE (PF) 2500 MCG/50ML IJ SOLN
50.00 | INTRAMUSCULAR | Status: DC
Start: ? — End: 2019-06-02

## 2019-06-02 MED ORDER — ENOXAPARIN SODIUM 30 MG/0.3ML ~~LOC~~ SOLN
30.00 | SUBCUTANEOUS | Status: DC
Start: 2019-06-02 — End: 2019-06-02

## 2019-06-02 MED ORDER — BUDESONIDE 0.5 MG/2ML IN SUSP
0.50 | RESPIRATORY_TRACT | Status: DC
Start: 2019-06-02 — End: 2019-06-02

## 2019-06-02 MED ORDER — ACETAMINOPHEN 650 MG RE SUPP
650.00 | RECTAL | Status: DC
Start: ? — End: 2019-06-02

## 2019-06-02 MED ORDER — ALBUTEROL SULFATE (2.5 MG/3ML) 0.083% IN NEBU
2.50 | INHALATION_SOLUTION | RESPIRATORY_TRACT | Status: DC
Start: ? — End: 2019-06-02

## 2019-06-02 MED ORDER — SODIUM BICARBONATE 650 MG PO TABS
1300.00 | ORAL_TABLET | ORAL | Status: DC
Start: 2019-06-02 — End: 2019-06-02

## 2019-06-02 MED ORDER — SODIUM CHLORIDE 0.9 % IV SOLN
20.00 | INTRAVENOUS | Status: DC
Start: ? — End: 2019-06-02

## 2019-06-02 NOTE — Consult Note (Signed)
Pulmonary Critical Care Medicine [facility] [place]  PULMONARY SERVICE  Date of Service: 06/02/2019  PULMONARY CRITICAL CARE CONSULT   MARLIA SCHEWE  YHC:[id number]  DOB: 1965/10/28   DOA: 06/01/2019  Referring Physician: Carron Curie, MD  HPI: Cristina Norris is a 53 y.o. female seen for follow up of Acute on Chronic Respiratory Failure.  Patient has multiple medical problems Including chronic atrial fibrillation bradycardia Healthcare associated pneumonia heart failure morbid obesity pulmonary hypertension acute renal failure type 2 diabetes obstructive sleep apnea who had been [facility] discharge in February to a nursing home.  Patient presented back to acute care earlier this month with mental status changes and hypoglycemia.  The patient was diagnosed with gram-negative sepsis and also the possibility of MRSA.  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  Was admitted to the hospital with infectious disease neurology cardiology pulmonary palliative care was involved.  It appears that patient had ongoing issues with low hemoglobin requiring transfusions.  Patient was treated for an sepsis with antibiotics now is transferred to our facility for further management currently she is on full support is having some dyssynchrony with the ventilator we change her settings she is actually doing better with the pressure support.  Also of note she was significantly hypertensive chest x-ray was ordered does not show florid pulmonary edema.  Review of Systems:  ROS performed and is unremarkable other than noted above.  Past Medical History:  Diagnosis Date  . Allergy history unknown  . 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 Anemia  . Arthritis  . Congestive heart failure (*)  . Coronary artery disease  . Depression, major 11/16/2011  . 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 Hyperlipidemia 11/16/2011  . Hypertension  associated with diabetes (*) 11/16/2011  . Hypertensive cardiomyopathy (*) 11/16/2011  . Insomnia 11/16/2011  . Low back pain 08/19/2016  . Menopause  . Morbid obesity (*)  . Morbid obesity with BMI of 50.0-59.9, adult (*) 11/16/2011  . Osteoporosis  . Seasonal allergies 11/16/2011  . Sleep apnea, obstructive 11/16/2011  cpap 9cm h2o, xsm swift fx & s pillows, [facility]  . 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 Arm surgery Right  . Dilation and curettage of uterus  . Knee surgery Right  . Tons  . Tonsillectomy  . Tubal ligation   No Known Allergies  Social History   Socioeconomic History  . Marital status: Single  Spouse name: Not on file  . Number of children: 2  . Years of education: Not on file  . Highest education level: Not on file  Occupational History  . Not on file  Social Needs  . Financial resource strain: Hard  . Food insecurity  Worry: Sometimes true  Inability: Sometimes true  . Transportation needs  Medical: Yes  Non-medical: Yes  Tobacco Use  . Smoking status: Former Smoker    Medications: Reviewed on Rounds  Physical Exam:  Vitals: Temperature 97.5 pulse 93 respiratory rate 13 blood pressure is 171/98 saturations 100%  Ventilator Settings mode ventilation assist control FiO2 30% tidal volume 420 PEEP 5  . General: Comfortable at this time . Eyes: Grossly normal lids, irises & conjunctiva . ENT: grossly tongue is normal . Neck: no obvious mass . Cardiovascular: S1-S2 normal no gallop or rub . Respiratory: No rhonchi no rales are noted at this  time . Abdomen: Soft and nontender . Skin: no rash seen on limited exam . Musculoskeletal: not rigid . Psychiatric:unable to assess . Neurologic: no seizure no involuntary movements         Labs on Admission:  Basic Metabolic Panel: Recent Labs  Lab 06/02/19 0012  NA 140  K 4.2  CL 109  CO2 19*  GLUCOSE 95  BUN 79*   CREATININE 2.52*  CALCIUM 7.7*    Recent Labs  Lab 06/01/19 2239  PHART 7.413  PCO2ART 32.6  PO2ART 139*  HCO3 20.4  O2SAT 99.6    Liver Function Tests: Recent Labs  Lab 06/02/19 0012  AST 21  ALT 19  ALKPHOS 82  BILITOT 0.4  PROT 5.9*  ALBUMIN 1.4*   No results for input(s): LIPASE, AMYLASE in the last 168 hours. No results for input(s): AMMONIA in the last 168 hours.  CBC: Recent Labs  Lab 06/02/19 0012  WBC 12.5*  HGB 8.2*  HCT 24.8*  MCV 90.8  PLT 299    Cardiac Enzymes: No results for input(s): CKTOTAL, CKMB, CKMBINDEX, TROPONINI in the last 168 hours.  BNP (last 3 results) Recent Labs    07/06/18 0628  BNP 1,109.4*    ProBNP (last 3 results) No results for input(s): PROBNP in the last 8760 hours.   Radiological Exams on Admission: DG Chest Port 1 View  Result Date: 06/02/2019 CLINICAL DATA:  Respiratory failure. EXAM: PORTABLE CHEST 1 VIEW COMPARISON:  07/17/2018 FINDINGS: Tracheostomy tube, NG tube, and PICC all appear in good position. The tip of the PICC is in the superior vena cava just below the carina in good position. Heart size and pulmonary vascularity are normal. Slight increased density at the left base posterior medially may represent atelectasis. Lungs are otherwise clear. No acute bone abnormality. IMPRESSION: 1. Slight increased density at the left base posterior medially may represent atelectasis. Otherwise, no significant abnormalities. 2. Tubes and lines in good position. Electronically Signed   By: Lorriane Shire M.D.   On: 06/02/2019 12:43   DG Abd Portable 1V  Result Date: 06/02/2019 CLINICAL DATA:  Nasogastric tube placement. EXAM: PORTABLE ABDOMEN - 1 VIEW COMPARISON:  07/04/2018 FINDINGS: Tip and side port of the enteric tube below the diaphragm in the stomach. No bowel dilatation to suggest obstruction. IMPRESSION: Tip and side port of the enteric tube below the diaphragm in the stomach. Electronically Signed   By: Keith Rake M.D.   On: 06/02/2019 01:11    Assessment/Plan Active Problems:   Acute on chronic respiratory failure with hypoxia (HCC)   Acute tubular necrosis (HCC)   Chronic atrial fibrillation with rapid ventricular response (HCC)   Severe sepsis (HCC)   Healthcare-associated pneumonia   1. Acute on chronic respiratory failure with hypoxia patient is having dyssynchrony with the ventilator switched around for several months currently does best with the pressure support mode.  So we switched over to pressure support with an apnea backup also we will get a follow-up ABG on her on this current setting. 2. Septic shock patient was treated diagnosed now is doing better 3. Chronic atrial fibrillation she had presented to the hospital with dig toxicity we will be monitoring her very closely we will continue with supportive care. 4. Healthcare associated pneumonia treated clinically is resolved patient had a chest x-ray done awaiting the issues with her ventilation there is some increased density in the left base atelectasis likely. 5. Acute renal failure patient does have some worsening of her  creatinine from her baseline of 1 to about 2.5 to monitor her fluid status closely.  I have personally seen and evaluated the patient, evaluated laboratory and imaging results, formulated the assessment and plan and placed orders. The Patient requires high complexity decision making for assessment and support.  Case was discussed on Rounds with the Respiratory Therapy Staff Time Spent 70minutes  Yevonne PaxSaadat A Bexley Laubach, MD [facility] Pulmonary Critical Care Medicine Sleep Medicine

## 2019-06-03 DIAGNOSIS — J9621 Acute and chronic respiratory failure with hypoxia: Secondary | ICD-10-CM | POA: Diagnosis not present

## 2019-06-03 DIAGNOSIS — J189 Pneumonia, unspecified organism: Secondary | ICD-10-CM | POA: Diagnosis not present

## 2019-06-03 DIAGNOSIS — I482 Chronic atrial fibrillation, unspecified: Secondary | ICD-10-CM | POA: Diagnosis not present

## 2019-06-03 DIAGNOSIS — N170 Acute kidney failure with tubular necrosis: Secondary | ICD-10-CM | POA: Diagnosis not present

## 2019-06-03 LAB — URINALYSIS, ROUTINE W REFLEX MICROSCOPIC
Bilirubin Urine: NEGATIVE
Glucose, UA: NEGATIVE mg/dL
Ketones, ur: NEGATIVE mg/dL
Nitrite: NEGATIVE
Protein, ur: 30 mg/dL — AB
Specific Gravity, Urine: 1.012 (ref 1.005–1.030)
WBC, UA: >50 WBC/hpf — ABNORMAL HIGH (ref 0–5)
pH: 5 (ref 5.0–8.0)

## 2019-06-03 NOTE — Progress Notes (Addendum)
Pulmonary Critical Care Medicine [place]   PULMONARY CRITICAL CARE SERVICE  PROGRESS NOTE  Date of Service: 06/03/2019  Cristina Norris  CZY:[id number]  DOB: 29-Oct-1965   DOA: 06/01/2019  Referring Physician: Merton Border, MD  HPI: Cristina Norris is a 53 y.o. female seen for follow up of Acute on Chronic Respiratory Failure.  Patient currently is on pressure support looks like, she is actually tolerating it fairly well.  Right now is on 30% FiO2 good tidal volumes are noted.  Overnight she was on 16/5 on pressure support after we will switch her over her synchrony with the ventilator has improved today we will place her on 12/5 as tolerated  Medications: Reviewed on Rounds  Physical Exam:  Vitals: Temperature 96.5 pulse 89 respiratory 11 blood pressure 150/80 saturations 100%  Ventilator Settings mode of ventilation pressure support FiO2 30% tidal line 673 pressure poor 12 PEEP 5  . General: Comfortable at this time . Eyes: Grossly normal lids, irises & conjunctiva . ENT: grossly tongue is normal . Neck: no obvious mass . Cardiovascular: S1 S2 normal no gallop . Respiratory: No rhonchi no rales are noted at this time . Abdomen: soft . Skin: no rash seen on limited exam . Musculoskeletal: not rigid . Psychiatric:unable to assess . Neurologic: no seizure no involuntary movements         Lab Data:   Basic Metabolic Panel: Recent Labs  Lab 06/02/19 0012  NA 140  K 4.2  CL 109  CO2 19*  GLUCOSE 95  BUN 79*  CREATININE 2.52*  CALCIUM 7.7*    ABG: Recent Labs  Lab 06/01/19 2239 06/02/19 2010  PHART 7.413 7.392  PCO2ART 32.6 37.3  PO2ART 139* 141*  HCO3 20.4 22.2  O2SAT 99.6 99.1    Liver Function Tests: Recent Labs  Lab 06/02/19 0012  AST 21  ALT 19  ALKPHOS 82  BILITOT 0.4  PROT 5.9*  ALBUMIN 1.4*   No results for input(s): LIPASE, AMYLASE in the last 168 hours. No results for input(s): AMMONIA in the last 168  hours.  CBC: Recent Labs  Lab 06/02/19 0012  WBC 12.5*  HGB 8.2*  HCT 24.8*  MCV 90.8  PLT 299    Cardiac Enzymes: No results for input(s): CKTOTAL, CKMB, CKMBINDEX, TROPONINI in the last 168 hours.  BNP (last 3 results) Recent Labs    07/06/18 0628  BNP 1,109.4*    ProBNP (last 3 results) No results for input(s): PROBNP in the last 8760 hours.  Radiological Exams: DG Chest Port 1 View  Result Date: 06/02/2019 CLINICAL DATA:  Respiratory failure. EXAM: PORTABLE CHEST 1 VIEW COMPARISON:  07/17/2018 FINDINGS: Tracheostomy tube, NG tube, and PICC all appear in good position. The tip of the PICC is in the superior vena cava just below the carina in good position. Heart size and pulmonary vascularity are normal. Slight increased density at the left base posterior medially may represent atelectasis. Lungs are otherwise clear. No acute bone abnormality. IMPRESSION: 1. Slight increased density at the left base posterior medially may represent atelectasis. Otherwise, no significant abnormalities. 2. Tubes and lines in good position. Electronically Signed   By: Lorriane Shire M.D.   On: 06/02/2019 12:43   DG Abd Portable 1V  Result Date: 06/02/2019 CLINICAL DATA:  Nasogastric tube placement. EXAM: PORTABLE ABDOMEN - 1 VIEW COMPARISON:  07/04/2018 FINDINGS: Tip and side port of the enteric tube below the diaphragm in the stomach. No bowel dilatation to suggest obstruction.  IMPRESSION: Tip and side port of the enteric tube below the diaphragm in the stomach. Electronically Signed   By: Narda Rutherford M.D.   On: 06/02/2019 01:11    Assessment/Plan Active Problems:   Acute on chronic respiratory failure with hypoxia (HCC)   Acute tubular necrosis (HCC)   Chronic atrial fibrillation with rapid ventricular response (HCC)   Severe sepsis (HCC)   Healthcare-associated pneumonia   1. Acute on chronic respiratory failure with hypoxia patient is on pressure support ventilation seems to be  tolerating it better we will continue with on 30% FiO2 on the 12/5 setting. 2. ATN treated clinically improved 3. Chronic atrial fibrillation rate is controlled 4. Severe sepsis hemodynamics are stable we will continue with present management 5. Healthcare associated pneumonia chest x-ray results as already noted above slight increase in density we will get a need to watch this closely.   I have personally seen and evaluated the patient, evaluated laboratory and imaging results, formulated the assessment and plan and placed orders.  Time 35 minutes patient critically ill in danger of cardiac arrest and death needs close monitoring secondary to ventilator changes and dyssynchrony The Patient requires high complexity decision making for assessment and support.  Case was discussed on Rounds with the Respiratory Therapy Staff  Yevonne Pax, MD [facility] Pulmonary Critical Care Medicine Sleep Medicine

## 2019-06-04 DIAGNOSIS — N170 Acute kidney failure with tubular necrosis: Secondary | ICD-10-CM | POA: Diagnosis not present

## 2019-06-04 DIAGNOSIS — J189 Pneumonia, unspecified organism: Secondary | ICD-10-CM | POA: Diagnosis not present

## 2019-06-04 DIAGNOSIS — I482 Chronic atrial fibrillation, unspecified: Secondary | ICD-10-CM | POA: Diagnosis not present

## 2019-06-04 DIAGNOSIS — J9621 Acute and chronic respiratory failure with hypoxia: Secondary | ICD-10-CM | POA: Diagnosis not present

## 2019-06-04 LAB — BASIC METABOLIC PANEL
Anion gap: 11 (ref 5–15)
BUN: 97 mg/dL — ABNORMAL HIGH (ref 6–20)
CO2: 19 mmol/L — ABNORMAL LOW (ref 22–32)
Calcium: 8.9 mg/dL (ref 8.9–10.3)
Chloride: 112 mmol/L — ABNORMAL HIGH (ref 98–111)
Creatinine, Ser: 1.96 mg/dL — ABNORMAL HIGH (ref 0.44–1.00)
GFR calc Af Amer: 33 mL/min — ABNORMAL LOW (ref >60)
GFR calc non Af Amer: 28 mL/min — ABNORMAL LOW (ref >60)
Glucose, Bld: 158 mg/dL — ABNORMAL HIGH (ref 70–99)
Potassium: 4.5 mmol/L (ref 3.5–5.1)
Sodium: 142 mmol/L (ref 135–145)

## 2019-06-04 LAB — CBC
HCT: 38.1 % (ref 36.0–46.0)
Hemoglobin: 12.1 g/dL (ref 12.0–15.0)
MCH: 29.9 pg (ref 26.0–34.0)
MCHC: 31.8 g/dL (ref 30.0–36.0)
MCV: 94.1 fL (ref 80.0–100.0)
Platelets: 270 10*3/uL (ref 150–400)
RBC: 4.05 MIL/uL (ref 3.87–5.11)
RDW: 18.9 % — ABNORMAL HIGH (ref 11.5–15.5)
WBC: 17.2 10*3/uL — ABNORMAL HIGH (ref 4.0–10.5)
nRBC: 0.5 % — ABNORMAL HIGH (ref 0.0–0.2)

## 2019-06-04 LAB — COMPREHENSIVE METABOLIC PANEL
ALT: 16 U/L (ref 0–44)
AST: 22 U/L (ref 15–41)
Albumin: 1.1 g/dL — ABNORMAL LOW (ref 3.5–5.0)
Alkaline Phosphatase: 82 U/L (ref 38–126)
Anion gap: 11 (ref 5–15)
BUN: 103 mg/dL — ABNORMAL HIGH (ref 6–20)
CO2: 19 mmol/L — ABNORMAL LOW (ref 22–32)
Calcium: 8.8 mg/dL — ABNORMAL LOW (ref 8.9–10.3)
Chloride: 110 mmol/L (ref 98–111)
Creatinine, Ser: 1.91 mg/dL — ABNORMAL HIGH (ref 0.44–1.00)
GFR calc Af Amer: 34 mL/min — ABNORMAL LOW (ref >60)
GFR calc non Af Amer: 29 mL/min — ABNORMAL LOW (ref >60)
Glucose, Bld: 151 mg/dL — ABNORMAL HIGH (ref 70–99)
Potassium: 4.5 mmol/L (ref 3.5–5.1)
Sodium: 140 mmol/L (ref 135–145)
Total Bilirubin: 0.1 mg/dL — ABNORMAL LOW (ref 0.3–1.2)
Total Protein: 4.9 g/dL — ABNORMAL LOW (ref 6.5–8.1)

## 2019-06-04 LAB — BLOOD GAS, ARTERIAL
Acid-base deficit: 4.5 mmol/L — ABNORMAL HIGH (ref 0.0–2.0)
Allens test (pass/fail): POSITIVE — AB
Bicarbonate: 20.8 mmol/L (ref 20.0–28.0)
FIO2: 30
O2 Saturation: 94.3 %
Patient temperature: 37
pCO2 arterial: 43.2 mmHg (ref 32.0–48.0)
pH, Arterial: 7.305 — ABNORMAL LOW (ref 7.350–7.450)
pO2, Arterial: 73.4 mmHg — ABNORMAL LOW (ref 83.0–108.0)

## 2019-06-04 LAB — MAGNESIUM: Magnesium: 2 mg/dL (ref 1.7–2.4)

## 2019-06-04 NOTE — Progress Notes (Addendum)
Pulmonary Critical Care Medicine [place]   PULMONARY CRITICAL CARE SERVICE  PROGRESS NOTE  Date of Service: 06/04/2019  [name]  POE:[id number]  DOB: 1965-07-13   DOA: 06/01/2019  Referring Physician: Merton Border, MD  HPI: Cristina Norris is a 53 y.o. female seen for follow up of Acute on Chronic Respiratory Failure.  Patient currently is on full support on the ventilator.  Apparently had a rapid response this morning so patient currently is not weaning right now remains on full support on 30% FiO2  Medications: Reviewed on Rounds  Physical Exam:  Vitals: Temperature 96.2 pulse 88 respiratory rate is 11 blood pressure 164/94 saturations 100%  Ventilator Settings mode of ventilation pressure assist control FiO2 is 30% inspiratory pressure 15 PEEP five  . General: Comfortable at this time . Eyes: Grossly normal lids, irises & conjunctiva . ENT: grossly tongue is normal . Neck: no obvious mass . Cardiovascular: S1 S2 normal no gallop . Respiratory: No rhonchi no rales are noted . Abdomen: soft . Skin: no rash seen on limited exam . Musculoskeletal: not rigid . Psychiatric:unable to assess . Neurologic: no seizure no involuntary movements         Lab Data:   Basic Metabolic Panel: Recent Labs  Lab 06/02/19 0012 06/04/19 1136  NA 140 142  K 4.2 4.5  CL 109 112*  CO2 19* 19*  GLUCOSE 95 158*  BUN 79* 97*  CREATININE 2.52* 1.96*  CALCIUM 7.7* 8.9  MG  --  2.0    ABG: Recent Labs  Lab 06/01/19 2239 06/02/19 2010 06/04/19 1043  PHART 7.413 7.392 7.305*  PCO2ART 32.6 37.3 43.2  PO2ART 139* 141* 73.4*  HCO3 20.4 22.2 20.8  O2SAT 99.6 99.1 94.3    Liver Function Tests: Recent Labs  Lab 06/02/19 0012  AST 21  ALT 19  ALKPHOS 82  BILITOT 0.4  PROT 5.9*  ALBUMIN 1.4*   No results for input(s): LIPASE, AMYLASE in the last 168 hours. No results for input(s): AMMONIA in the last 168 hours.  CBC: Recent Labs  Lab  06/02/19 0012 06/04/19 1136  WBC 12.5* 17.2*  HGB 8.2* 12.1  HCT 24.8* 38.1  MCV 90.8 94.1  PLT 299 270    Cardiac Enzymes: No results for input(s): CKTOTAL, CKMB, CKMBINDEX, TROPONINI in the last 168 hours.  BNP (last 3 results) Recent Labs    07/06/18 0628  BNP 1,109.4*    ProBNP (last 3 results) No results for input(s): PROBNP in the last 8760 hours.  Radiological Exams: No results found.  Assessment/Plan Active Problems:   Acute on chronic respiratory failure with hypoxia (HCC)   Acute tubular necrosis (HCC)   Chronic atrial fibrillation with rapid ventricular response (HCC)   Severe sepsis (HCC)   Healthcare-associated pneumonia   1. Acute on chronic respiratory failure hypoxia plan is to continue with full support right now patient will be monitored closely before reattempting weaning. 2. ATN treated we will continue to follow labs 3. Chronic atrial fibrillation rate now rate controlled 4. Severe sepsis resolved 5. Healthcare associated pneumonia treated follow radiologically.   I have personally seen and evaluated the patient, evaluated laboratory and imaging results, formulated the assessment and plan and placed orders.  Time 35 minutes as part of rapid response patient critically ill had acute change in status The Patient requires high complexity decision making for assessment and support.  Case was discussed on Rounds with the Respiratory Therapy Staff  Cristina Gee,  MD [facility] Pulmonary Critical Care Medicine Sleep Medicine

## 2019-06-05 ENCOUNTER — Institutional Professional Consult (permissible substitution) (HOSPITAL_COMMUNITY): Payer: Medicare Other

## 2019-06-05 DIAGNOSIS — J9621 Acute and chronic respiratory failure with hypoxia: Secondary | ICD-10-CM | POA: Diagnosis not present

## 2019-06-05 DIAGNOSIS — A419 Sepsis, unspecified organism: Secondary | ICD-10-CM | POA: Diagnosis not present

## 2019-06-05 DIAGNOSIS — N170 Acute kidney failure with tubular necrosis: Secondary | ICD-10-CM | POA: Diagnosis not present

## 2019-06-05 DIAGNOSIS — J189 Pneumonia, unspecified organism: Secondary | ICD-10-CM | POA: Diagnosis not present

## 2019-06-05 LAB — BLOOD GAS, ARTERIAL
Acid-base deficit: 1.9 mmol/L (ref 0.0–2.0)
Bicarbonate: 22.9 mmol/L (ref 20.0–28.0)
FIO2: 30
O2 Saturation: 98.6 %
Patient temperature: 36.4
pCO2 arterial: 41.4 mmHg (ref 32.0–48.0)
pH, Arterial: 7.358 (ref 7.350–7.450)
pO2, Arterial: 122 mmHg — ABNORMAL HIGH (ref 83.0–108.0)

## 2019-06-05 MED ORDER — GENERIC EXTERNAL MEDICATION
Status: DC
Start: ? — End: 2019-06-05

## 2019-06-05 MED FILL — Medication: Qty: 1 | Status: AC

## 2019-06-05 NOTE — Progress Notes (Signed)
Pulmonary Critical Care Medicine [place]   PULMONARY CRITICAL CARE SERVICE  PROGRESS NOTE  Date of Service: 06/05/2019  Cristina Norris  DEY:[id number]  DOB: [birth year]   DOA: 06/01/2019  Referring Physician: Merton Border, MD  HPI: Cristina Norris is a 53 y.o. female seen for follow up of Acute on Chronic Respiratory Failure.  Patient remains on full support currently on pressure control mode  Medications: Reviewed on Rounds  Physical Exam:  Vitals: Temperature 97.2 pulse 67 respiratory 15 blood pressures 122/69 saturations 100%  Ventilator Settings mode ventilation pressure assist control FiO2 is 28% tidal volume 450 PEEP 5  . General: Comfortable at this time . Eyes: Grossly normal lids, irises & conjunctiva . ENT: grossly tongue is normal . Neck: no obvious mass . Cardiovascular: S1 S2 normal no gallop . Respiratory: No rhonchi coarse breath sounds . Abdomen: soft . Skin: no rash seen on limited exam . Musculoskeletal: not rigid . Psychiatric:unable to assess . Neurologic: no seizure no involuntary movements         Lab Data:   Basic Metabolic Panel: Recent Labs  Lab 06/02/19 0012 06/04/19 1136 06/04/19 2251  NA 140 142 140  K 4.2 4.5 4.5  CL 109 112* 110  CO2 19* 19* 19*  GLUCOSE 95 158* 151*  BUN 79* 97* 103*  CREATININE 2.52* 1.96* 1.91*  CALCIUM 7.7* 8.9 8.8*  MG  --  2.0  --     ABG: Recent Labs  Lab 06/01/19 2239 06/02/19 2010 06/04/19 1043 06/05/19 0331  PHART 7.413 7.392 7.305* 7.358  PCO2ART 32.6 37.3 43.2 41.4  PO2ART 139* 141* 73.4* 122*  HCO3 20.4 22.2 20.8 22.9  O2SAT 99.6 99.1 94.3 98.6    Liver Function Tests: Recent Labs  Lab 06/02/19 0012 06/04/19 2251  AST 21 22  ALT 19 16  ALKPHOS 82 82  BILITOT 0.4 0.1*  PROT 5.9* 4.9*  ALBUMIN 1.4* 1.1*   No results for input(s): LIPASE, AMYLASE in the last 168 hours. No results for input(s): AMMONIA in the last 168 hours.  CBC: Recent Labs  Lab  06/02/19 0012 06/04/19 1136  WBC 12.5* 17.2*  HGB 8.2* 12.1  HCT 24.8* 38.1  MCV 90.8 94.1  PLT 299 270    Cardiac Enzymes: No results for input(s): CKTOTAL, CKMB, CKMBINDEX, TROPONINI in the last 168 hours.  BNP (last 3 results) Recent Labs    07/06/18 0628  BNP 1,109.4*    ProBNP (last 3 results) No results for input(s): PROBNP in the last 8760 hours.  Radiological Exams: DG Chest Port 1 View  Result Date: 06/05/2019 CLINICAL DATA:  53 year old female with acute on chronic respiratory failure. EXAM: PORTABLE CHEST 1 VIEW COMPARISON:  Portable chest 06/02/2019 and earlier. FINDINGS: Portable AP semi upright view at 0725 hours. Stable tracheostomy. Stable visible enteric tube. Stable right PICC line. Stable mediastinal contours. Slightly lower lung volumes. Mild increased streaky opacity at both lung bases. No pneumothorax or pulmonary edema. No pleural effusion. Negative visible bowel gas pattern. Stable visualized osseous structures. IMPRESSION: 1.  Stable lines and tubes. 2. Mildly lower lung volumes. Favor increased lung base atelectasis rather than infection since 06/02/2019. Electronically Signed   By: Genevie Ann M.D.   On: 06/05/2019 07:55    Assessment/Plan Active Problems:   Acute on chronic respiratory failure with hypoxia (HCC)   Acute tubular necrosis (HCC)   Chronic atrial fibrillation with rapid ventricular response (HCC)   Severe sepsis (HCC)   Healthcare-associated pneumonia  1. Acute on chronic respiratory failure hypoxia plan is to continue with full support on the ventilator.  Patient has good volumes noted at this time.  Yesterday patient had a rapid response we will continue to monitor closely. 2. ATN continue to monitor labs in recovery phase 3. Chronic atrial fibrillation rate is controlled right now 4. Severe sepsis hemodynamics are stable 5. Healthcare associated pneumonia treated we will continue to follow with   I have personally seen and  evaluated the patient, evaluated laboratory and imaging results, formulated the assessment and plan and placed orders. The Patient requires high complexity decision making for assessment and support.  Case was discussed on Rounds with the Respiratory Therapy Staff  Yevonne Pax, MD [facility] Pulmonary Critical Care Medicine Sleep Medicine

## 2019-06-06 DIAGNOSIS — J9621 Acute and chronic respiratory failure with hypoxia: Secondary | ICD-10-CM | POA: Diagnosis not present

## 2019-06-06 DIAGNOSIS — J189 Pneumonia, unspecified organism: Secondary | ICD-10-CM | POA: Diagnosis not present

## 2019-06-06 DIAGNOSIS — A419 Sepsis, unspecified organism: Secondary | ICD-10-CM | POA: Diagnosis not present

## 2019-06-06 DIAGNOSIS — N170 Acute kidney failure with tubular necrosis: Secondary | ICD-10-CM | POA: Diagnosis not present

## 2019-06-06 NOTE — Progress Notes (Signed)
Pulmonary Critical Care Medicine [facility]   PULMONARY CRITICAL CARE SERVICE  PROGRESS NOTE  Date of Service: 06/06/2019  Cristina Norris  UXL:[id number]  DOB: 1966-05-22   DOA: 06/01/2019  Referring Physician: Carron Curie, MD  HPI: Cristina Norris is a 53 y.o. female seen for follow up of Acute on Chronic Respiratory Failure.  Patient is on pressure support currently has been on 12/5 requiring 30% FiO2.  Medications: Reviewed on Rounds  Physical Exam:  Vitals: Temperature 97.0 pulse 62 respiratory 11 blood pressure 126/69 saturations 100%  Ventilator Settings mode of ventilation pressure support FiO2 30% pressure support 12 PEEP 5  . General: Comfortable at this time . Eyes: Grossly normal lids, irises & conjunctiva . ENT: grossly tongue is normal . Neck: no obvious mass . Cardiovascular: S1 S2 normal no gallop . Respiratory: No rhonchi coarse breath sounds are noted . Abdomen: soft . Skin: no rash seen on limited exam . Musculoskeletal: not rigid . Psychiatric:unable to assess . Neurologic: no seizure no involuntary movements         Lab Data:   Basic Metabolic Panel: Recent Labs  Lab 06/02/19 0012 06/04/19 1136 06/04/19 2251  NA 140 142 140  K 4.2 4.5 4.5  CL 109 112* 110  CO2 19* 19* 19*  GLUCOSE 95 158* 151*  BUN 79* 97* 103*  CREATININE 2.52* 1.96* 1.91*  CALCIUM 7.7* 8.9 8.8*  MG  --  2.0  --     ABG: Recent Labs  Lab 06/01/19 2239 06/02/19 2010 06/04/19 1043 06/05/19 0331  PHART 7.413 7.392 7.305* 7.358  PCO2ART 32.6 37.3 43.2 41.4  PO2ART 139* 141* 73.4* 122*  HCO3 20.4 22.2 20.8 22.9  O2SAT 99.6 99.1 94.3 98.6    Liver Function Tests: Recent Labs  Lab 06/02/19 0012 06/04/19 2251  AST 21 22  ALT 19 16  ALKPHOS 82 82  BILITOT 0.4 0.1*  PROT 5.9* 4.9*  ALBUMIN 1.4* 1.1*   No results for input(s): LIPASE, AMYLASE in the last 168 hours. No results for input(s): AMMONIA in the last 168 hours.  CBC: Recent  Labs  Lab 06/02/19 0012 06/04/19 1136  WBC 12.5* 17.2*  HGB 8.2* 12.1  HCT 24.8* 38.1  MCV 90.8 94.1  PLT 299 270    Cardiac Enzymes: No results for input(s): CKTOTAL, CKMB, CKMBINDEX, TROPONINI in the last 168 hours.  BNP (last 3 results) Recent Labs    07/06/18 0628  BNP 1,109.4*    ProBNP (last 3 results) No results for input(s): PROBNP in the last 8760 hours.  Radiological Exams: DG Chest Port 1 View  Result Date: 06/05/2019 CLINICAL DATA:  53 year old female with acute on chronic respiratory failure. EXAM: PORTABLE CHEST 1 VIEW COMPARISON:  Portable chest 06/02/2019 and earlier. FINDINGS: Portable AP semi upright view at 0725 hours. Stable tracheostomy. Stable visible enteric tube. Stable right PICC line. Stable mediastinal contours. Slightly lower lung volumes. Mild increased streaky opacity at both lung bases. No pneumothorax or pulmonary edema. No pleural effusion. Negative visible bowel gas pattern. Stable visualized osseous structures. IMPRESSION: 1.  Stable lines and tubes. 2. Mildly lower lung volumes. Favor increased lung base atelectasis rather than infection since 06/02/2019. Electronically Signed   By: Odessa Fleming M.D.   On: 06/05/2019 07:55    Assessment/Plan Active Problems:   Acute on chronic respiratory failure with hypoxia (HCC)   Acute tubular necrosis (HCC)   Chronic atrial fibrillation with rapid ventricular response (HCC)   Severe sepsis (HCC)  Healthcare-associated pneumonia   1. Acute on chronic respiratory failure with hypoxia at this time patient is weaning on pressure support plan is to continue to advance the wean 2. Acute tubular necrosis no change we will continue to follow 3. Chronic atrial fibrillation rate is controlled 4. Severe sepsis resolved 5. Healthcare associated pneumonia treated clinically improving   I have personally seen and evaluated the patient, evaluated laboratory and imaging results, formulated the assessment and plan  and placed orders. The Patient requires high complexity decision making for assessment and support.  Case was discussed on Rounds with the Respiratory Therapy Staff  Allyne Gee, MD [facility] Pulmonary Critical Care Medicine Sleep Medicine

## 2019-06-07 DIAGNOSIS — N170 Acute kidney failure with tubular necrosis: Secondary | ICD-10-CM | POA: Diagnosis not present

## 2019-06-07 DIAGNOSIS — J189 Pneumonia, unspecified organism: Secondary | ICD-10-CM | POA: Diagnosis not present

## 2019-06-07 DIAGNOSIS — A419 Sepsis, unspecified organism: Secondary | ICD-10-CM | POA: Diagnosis not present

## 2019-06-07 DIAGNOSIS — J9621 Acute and chronic respiratory failure with hypoxia: Secondary | ICD-10-CM | POA: Diagnosis not present

## 2019-06-07 NOTE — Progress Notes (Signed)
Pulmonary Critical Care Medicine [place]   PULMONARY CRITICAL CARE SERVICE  PROGRESS NOTE  Date of Service: 06/07/2019  Cristina Norris  HEN:[id number]  DOB: 1966-02-26   DOA: 06/01/2019  Referring Physician: Merton Border, MD  HPI: Cristina Norris is a 53 y.o. female seen for follow up of Acute on Chronic Respiratory Failure.  Patient is currently on pressure support mode at this time patient has been tolerating it fairly well we should be able to move to T collar  Medications: Reviewed on Rounds  Physical Exam:  Vitals: Temperature 97.0 pulse 86 respiratory rate 14 blood pressure is 169/92 saturations 100%  Ventilator Settings mode of ventilation pressure support FiO2 30% pressure poor 12 PEEP 5  . General: Comfortable at this time . Eyes: Grossly normal lids, irises & conjunctiva . ENT: grossly tongue is normal . Neck: no obvious mass . Cardiovascular: S1 S2 normal no gallop . Respiratory: No rhonchi coarse breath sounds are noted at this time . Abdomen: soft . Skin: no rash seen on limited exam . Musculoskeletal: not rigid . Psychiatric:unable to assess . Neurologic: no seizure no involuntary movements         Lab Data:   Basic Metabolic Panel: Recent Labs  Lab 06/02/19 0012 06/04/19 1136 06/04/19 2251  NA 140 142 140  K 4.2 4.5 4.5  CL 109 112* 110  CO2 19* 19* 19*  GLUCOSE 95 158* 151*  BUN 79* 97* 103*  CREATININE 2.52* 1.96* 1.91*  CALCIUM 7.7* 8.9 8.8*  MG  --  2.0  --     ABG: Recent Labs  Lab 06/01/19 2239 06/02/19 2010 06/04/19 1043 06/05/19 0331  PHART 7.413 7.392 7.305* 7.358  PCO2ART 32.6 37.3 43.2 41.4  PO2ART 139* 141* 73.4* 122*  HCO3 20.4 22.2 20.8 22.9  O2SAT 99.6 99.1 94.3 98.6    Liver Function Tests: Recent Labs  Lab 06/02/19 0012 06/04/19 2251  AST 21 22  ALT 19 16  ALKPHOS 82 82  BILITOT 0.4 0.1*  PROT 5.9* 4.9*  ALBUMIN 1.4* 1.1*   No results for input(s): LIPASE, AMYLASE in the last 168  hours. No results for input(s): AMMONIA in the last 168 hours.  CBC: Recent Labs  Lab 06/02/19 0012 06/04/19 1136  WBC 12.5* 17.2*  HGB 8.2* 12.1  HCT 24.8* 38.1  MCV 90.8 94.1  PLT 299 270    Cardiac Enzymes: No results for input(s): CKTOTAL, CKMB, CKMBINDEX, TROPONINI in the last 168 hours.  BNP (last 3 results) Recent Labs    07/06/18 0628  BNP 1,109.4*    ProBNP (last 3 results) No results for input(s): PROBNP in the last 8760 hours.  Radiological Exams: No results found.  Assessment/Plan Active Problems:   Acute on chronic respiratory failure with hypoxia (HCC)   Acute tubular necrosis (HCC)   Chronic atrial fibrillation with rapid ventricular response (HCC)   Severe sepsis (HCC)   Healthcare-associated pneumonia   1. Acute on chronic respiratory failure with hypoxia the plan is to continue with advancing the wean today we will start on T collar trials. 2. Acute tubular necrosis continue to follow labs 3. Chronic atrial fibrillation rate is controlled 4. Severe sepsis resolved hemodynamics are stable 5. Healthcare associated pneumonia treated and is clinically showing signs of improvement.   I have personally seen and evaluated the patient, evaluated laboratory and imaging results, formulated the assessment and plan and placed orders. The Patient requires high complexity decision making for assessment and support.  Case was discussed on Rounds with the Respiratory Therapy Staff  Allyne Gee, MD [facility] Pulmonary Critical Care Medicine Sleep Medicine

## 2019-06-08 DIAGNOSIS — N170 Acute kidney failure with tubular necrosis: Secondary | ICD-10-CM | POA: Diagnosis not present

## 2019-06-08 DIAGNOSIS — J9621 Acute and chronic respiratory failure with hypoxia: Secondary | ICD-10-CM | POA: Diagnosis not present

## 2019-06-08 DIAGNOSIS — J189 Pneumonia, unspecified organism: Secondary | ICD-10-CM | POA: Diagnosis not present

## 2019-06-08 DIAGNOSIS — A419 Sepsis, unspecified organism: Secondary | ICD-10-CM | POA: Diagnosis not present

## 2019-06-08 NOTE — Progress Notes (Signed)
Pulmonary Critical Care Medicine [place]   PULMONARY CRITICAL CARE SERVICE  PROGRESS NOTE  Date of Service: 06/08/2019  KADE RICKELS  LFY:[id number]  DOB: 1966-05-12   DOA: 06/01/2019  Referring Physician: Merton Border, MD  HPI: Cristina Norris is a 53 y.o. female seen for follow up of Acute on Chronic Respiratory Failure.  Patient was weaning on T collar the goal is for 8 hours the plan will be to continue to advance on that figure  Medications: Reviewed on Rounds  Physical Exam:  Vitals: Temperature 97.0 pulse 76 respiratory 15 blood pressure 153/87 saturations 100%  Ventilator Settings on T collar currently 28% FiO2  . General: Comfortable at this time . Eyes: Grossly normal lids, irises & conjunctiva . ENT: grossly tongue is normal . Neck: no obvious mass . Cardiovascular: S1 S2 normal no gallop . Respiratory: No rhonchi no rales are noted at this time . Abdomen: soft . Skin: no rash seen on limited exam . Musculoskeletal: not rigid . Psychiatric:unable to assess . Neurologic: no seizure no involuntary movements         Lab Data:   Basic Metabolic Panel: Recent Labs  Lab 06/02/19 0012 06/04/19 1136 06/04/19 2251  NA 140 142 140  K 4.2 4.5 4.5  CL 109 112* 110  CO2 19* 19* 19*  GLUCOSE 95 158* 151*  BUN 79* 97* 103*  CREATININE 2.52* 1.96* 1.91*  CALCIUM 7.7* 8.9 8.8*  MG  --  2.0  --     ABG: Recent Labs  Lab 06/01/19 2239 06/02/19 2010 06/04/19 1043 06/05/19 0331  PHART 7.413 7.392 7.305* 7.358  PCO2ART 32.6 37.3 43.2 41.4  PO2ART 139* 141* 73.4* 122*  HCO3 20.4 22.2 20.8 22.9  O2SAT 99.6 99.1 94.3 98.6    Liver Function Tests: Recent Labs  Lab 06/02/19 0012 06/04/19 2251  AST 21 22  ALT 19 16  ALKPHOS 82 82  BILITOT 0.4 0.1*  PROT 5.9* 4.9*  ALBUMIN 1.4* 1.1*   No results for input(s): LIPASE, AMYLASE in the last 168 hours. No results for input(s): AMMONIA in the last 168 hours.  CBC: Recent Labs   Lab 06/02/19 0012 06/04/19 1136  WBC 12.5* 17.2*  HGB 8.2* 12.1  HCT 24.8* 38.1  MCV 90.8 94.1  PLT 299 270    Cardiac Enzymes: No results for input(s): CKTOTAL, CKMB, CKMBINDEX, TROPONINI in the last 168 hours.  BNP (last 3 results) Recent Labs    07/06/18 0628  BNP 1,109.4*    ProBNP (last 3 results) No results for input(s): PROBNP in the last 8760 hours.  Radiological Exams: No results found.  Assessment/Plan Active Problems:   Acute on chronic respiratory failure with hypoxia (HCC)   Acute tubular necrosis (HCC)   Chronic atrial fibrillation with rapid ventricular response (HCC)   Severe sepsis (HCC)   Healthcare-associated pneumonia   1. Acute on chronic respiratory failure with hypoxia plan is to continue with T collar trials goal is 8 hours 2. Acute tubular necrosis follow labs 3. Chronic atrial fibrillation rate controlled 4. Severe sepsis hemodynamics are stable 5. Healthcare associated pneumonia treated we will continue to follow   I have personally seen and evaluated the patient, evaluated laboratory and imaging results, formulated the assessment and plan and placed orders. The Patient requires high complexity decision making for assessment and support.  Case was discussed on Rounds with the Respiratory Therapy Staff  Allyne Gee, MD [facility] Pulmonary Critical Care Medicine Sleep Medicine

## 2019-06-09 DIAGNOSIS — J9621 Acute and chronic respiratory failure with hypoxia: Secondary | ICD-10-CM

## 2019-06-09 DIAGNOSIS — N170 Acute kidney failure with tubular necrosis: Secondary | ICD-10-CM

## 2019-06-09 DIAGNOSIS — A419 Sepsis, unspecified organism: Secondary | ICD-10-CM

## 2019-06-09 DIAGNOSIS — R6520 Severe sepsis without septic shock: Secondary | ICD-10-CM

## 2019-06-09 DIAGNOSIS — J189 Pneumonia, unspecified organism: Secondary | ICD-10-CM

## 2019-06-09 NOTE — Progress Notes (Signed)
Pulmonary Critical Care Medicine [place]   PULMONARY CRITICAL CARE SERVICE  PROGRESS NOTE  Date of Service: 06/09/2019  Cristina Norris  [id number]:[id number]  DOB: [birth year]   DOA: 06/01/2019  Referring Physician: Merton Border, MD  HPI: Cristina Norris is a 53 y.o. female seen for follow up of Acute on Chronic Respiratory Failure.  Patient currently is on T collar has been on 28% FiO2 the goal today is for 12-hour wean  Medications: Reviewed on Rounds  Physical Exam:  Vitals: Temperature 97.1 pulse 73 respiratory rate is 10 blood pressure 136/74 saturations 100%  Ventilator Settings off the ventilator right now on T collar with an FiO2 of 28%  . General: Comfortable at this time . Eyes: Grossly normal lids, irises & conjunctiva . ENT: grossly tongue is normal . Neck: no obvious mass . Cardiovascular: S1 S2 normal no gallop . Respiratory: No rhonchi no rales are noted at this time . Abdomen: soft . Skin: no rash seen on limited exam . Musculoskeletal: not rigid . Psychiatric:unable to assess . Neurologic: no seizure no involuntary movements         Lab Data:   Basic Metabolic Panel: Recent Labs  Lab 06/04/19 1136 06/04/19 2251  NA 142 140  K 4.5 4.5  CL 112* 110  CO2 19* 19*  GLUCOSE 158* 151*  BUN 97* 103*  CREATININE 1.96* 1.91*  CALCIUM 8.9 8.8*  MG 2.0  --     ABG: Recent Labs  Lab 06/02/19 2010 06/04/19 1043 06/05/19 0331  PHART 7.392 7.305* 7.358  PCO2ART 37.3 43.2 41.4  PO2ART 141* 73.4* 122*  HCO3 22.2 20.8 22.9  O2SAT 99.1 94.3 98.6    Liver Function Tests: Recent Labs  Lab 06/04/19 2251  AST 22  ALT 16  ALKPHOS 82  BILITOT 0.1*  PROT 4.9*  ALBUMIN 1.1*   No results for input(s): LIPASE, AMYLASE in the last 168 hours. No results for input(s): AMMONIA in the last 168 hours.  CBC: Recent Labs  Lab 06/04/19 1136  WBC 17.2*  HGB 12.1  HCT 38.1  MCV 94.1  PLT 270    Cardiac Enzymes: No results for  input(s): CKTOTAL, CKMB, CKMBINDEX, TROPONINI in the last 168 hours.  BNP (last 3 results) Recent Labs    07/06/18 0628  BNP 1,109.4*    ProBNP (last 3 results) No results for input(s): PROBNP in the last 8760 hours.  Radiological Exams: No results found.  Assessment/Plan Active Problems:   Acute on chronic respiratory failure with hypoxia (HCC)   Acute tubular necrosis (HCC)   Chronic atrial fibrillation with rapid ventricular response (HCC)   Severe sepsis (HCC)   Healthcare-associated pneumonia   1. Acute on chronic respiratory failure with hypoxia plan is to continue to wean for 12 hours off the ventilator on T collar 2. ATN treated improving 3. Chronic atrial fibrillation rate controlled 4. Severe sepsis resolved 5. Healthcare associated pneumonia treated clinically improving   I have personally seen and evaluated the patient, evaluated laboratory and imaging results, formulated the assessment and plan and placed orders. The Patient requires high complexity decision making for assessment and support.  Case was discussed on Rounds with the Respiratory Therapy Staff  Allyne Gee, MD [facility] Pulmonary Critical Care Medicine Sleep Medicine

## 2019-06-09 NOTE — Progress Notes (Deleted)
Pulmonary Critical Care Medicine [place]   PULMONARY CRITICAL CARE SERVICE  PROGRESS NOTE  Date of Service: 06/09/2019  Cristina Norris  VZD:[id number]  DOB: 25-Jun-1965   DOA: 06/01/2019  Referring Physician: Merton Border, MD  HPI: Cristina Norris is a 53 y.o. female seen for follow up of Acute on Chronic Respiratory Failure.  She is on 30% FiO2 on T collar doing fairly well  Medications: Reviewed on Rounds  Physical Exam:  Vitals: Temperature 96.9 pulse 90 respiratory rate 28 blood pressure 165/89 saturations 96%  Ventilator Settings off the ventilator right now on T collar with an FiO2 of 30%  . General: Comfortable at this time . Eyes: Grossly normal lids, irises & conjunctiva . ENT: grossly tongue is normal . Neck: no obvious mass . Cardiovascular: S1 S2 normal no gallop . Respiratory: No rhonchi no rales are noted at this time . Abdomen: soft . Skin: no rash seen on limited exam . Musculoskeletal: not rigid . Psychiatric:unable to assess . Neurologic: no seizure no involuntary movements         Lab Data:   Basic Metabolic Panel: Recent Labs  Lab 06/04/19 1136 06/04/19 2251  NA 142 140  K 4.5 4.5  CL 112* 110  CO2 19* 19*  GLUCOSE 158* 151*  BUN 97* 103*  CREATININE 1.96* 1.91*  CALCIUM 8.9 8.8*  MG 2.0  --     ABG: Recent Labs  Lab 06/02/19 2010 06/04/19 1043 06/05/19 0331  PHART 7.392 7.305* 7.358  PCO2ART 37.3 43.2 41.4  PO2ART 141* 73.4* 122*  HCO3 22.2 20.8 22.9  O2SAT 99.1 94.3 98.6    Liver Function Tests: Recent Labs  Lab 06/04/19 2251  AST 22  ALT 16  ALKPHOS 82  BILITOT 0.1*  PROT 4.9*  ALBUMIN 1.1*   No results for input(s): LIPASE, AMYLASE in the last 168 hours. No results for input(s): AMMONIA in the last 168 hours.  CBC: Recent Labs  Lab 06/04/19 1136  WBC 17.2*  HGB 12.1  HCT 38.1  MCV 94.1  PLT 270    Cardiac Enzymes: No results for input(s): CKTOTAL, CKMB, CKMBINDEX, TROPONINI in  the last 168 hours.  BNP (last 3 results) Recent Labs    07/06/18 0628  BNP 1,109.4*    ProBNP (last 3 results) No results for input(s): PROBNP in the last 8760 hours.  Radiological Exams: No results found.  Assessment/Plan Active Problems:   Acute on chronic respiratory failure with hypoxia (HCC)   Acute tubular necrosis (HCC)   Chronic atrial fibrillation with rapid ventricular response (HCC)   Severe sepsis (HCC)   Healthcare-associated pneumonia   1. Acute on chronic respiratory failure hypoxia plan is to continue with T collar weaning as tolerated right now is on 30% FiO2 respiratory therapy will look at the possibility of PMV and capping after she is able to complete her protocol based T collar weaning 2. ATN treated resolved continue to monitor labs 3. Chronic atrial fibrillation rate is controlled 4. Severe sepsis resolved hemodynamics are stable 5. Healthcare associated pneumonia treated clinically is improving   I have personally seen and evaluated the patient, evaluated laboratory and imaging results, formulated the assessment and plan and placed orders. The Patient requires high complexity decision making for assessment and support.  Case was discussed on Rounds with the Respiratory Therapy Staff  Allyne Gee, MD [facility] Pulmonary Critical Care Medicine Sleep Medicine

## 2019-06-10 DIAGNOSIS — J189 Pneumonia, unspecified organism: Secondary | ICD-10-CM | POA: Diagnosis not present

## 2019-06-10 DIAGNOSIS — A419 Sepsis, unspecified organism: Secondary | ICD-10-CM | POA: Diagnosis not present

## 2019-06-10 DIAGNOSIS — J9621 Acute and chronic respiratory failure with hypoxia: Secondary | ICD-10-CM | POA: Diagnosis not present

## 2019-06-10 DIAGNOSIS — N170 Acute kidney failure with tubular necrosis: Secondary | ICD-10-CM | POA: Diagnosis not present

## 2019-06-10 NOTE — Progress Notes (Signed)
Pulmonary Critical Care Medicine [place]   PULMONARY CRITICAL CARE SERVICE  PROGRESS NOTE  Date of Service: 06/10/2019  Cristina Norris  UXL:[id number]  DOB: [birth year]   DOA: 06/01/2019  Referring Physician: Merton Border, MD  HPI: Cristina Norris is a 53 y.o. female seen for follow up of Acute on Chronic Respiratory Failure.  She is on T collar the goal is for 16 hours right now on 28% FiO2 good saturations  Medications: Reviewed on Rounds  Physical Exam:  Vitals: Temperature 97.0 pulse 86 respiratory 11 blood pressure 151/88 saturations 100%  Ventilator Settings off the ventilator right now on T collar FiO2 28%  . General: Comfortable at this time . Eyes: Grossly normal lids, irises & conjunctiva . ENT: grossly tongue is normal . Neck: no obvious mass . Cardiovascular: S1 S2 normal no gallop . Respiratory: No rhonchi no rales are noted at this time . Abdomen: soft . Skin: no rash seen on limited exam . Musculoskeletal: not rigid . Psychiatric:unable to assess . Neurologic: no seizure no involuntary movements         Lab Data:   Basic Metabolic Panel: Recent Labs  Lab 06/04/19 1136 06/04/19 2251  NA 142 140  K 4.5 4.5  CL 112* 110  CO2 19* 19*  GLUCOSE 158* 151*  BUN 97* 103*  CREATININE 1.96* 1.91*  CALCIUM 8.9 8.8*  MG 2.0  --     ABG: Recent Labs  Lab 06/04/19 1043 06/05/19 0331  PHART 7.305* 7.358  PCO2ART 43.2 41.4  PO2ART 73.4* 122*  HCO3 20.8 22.9  O2SAT 94.3 98.6    Liver Function Tests: Recent Labs  Lab 06/04/19 2251  AST 22  ALT 16  ALKPHOS 82  BILITOT 0.1*  PROT 4.9*  ALBUMIN 1.1*   No results for input(s): LIPASE, AMYLASE in the last 168 hours. No results for input(s): AMMONIA in the last 168 hours.  CBC: Recent Labs  Lab 06/04/19 1136  WBC 17.2*  HGB 12.1  HCT 38.1  MCV 94.1  PLT 270    Cardiac Enzymes: No results for input(s): CKTOTAL, CKMB, CKMBINDEX, TROPONINI in the last 168  hours.  BNP (last 3 results) Recent Labs    07/06/18 0628  BNP 1,109.4*    ProBNP (last 3 results) No results for input(s): PROBNP in the last 8760 hours.  Radiological Exams: No results found.  Assessment/Plan Active Problems:   Acute on chronic respiratory failure with hypoxia (HCC)   Acute tubular necrosis (HCC)   Chronic atrial fibrillation with rapid ventricular response (HCC)   Severe sepsis (HCC)   Healthcare-associated pneumonia   1. Acute on chronic respiratory failure with hypoxia patient will be continued on T collar trials currently is on 28% FiO2 goal 16 hours 2. Acute tubular necrosis no changes are noted at this time continue with monitoring labs 3. Chronic atrial fibrillation rate controlled 4. Severe sepsis resolved hemodynamics have been stable 5. Healthcare associated pneumonia treated we will continue with supportive care   I have personally seen and evaluated the patient, evaluated laboratory and imaging results, formulated the assessment and plan and placed orders. The Patient requires high complexity decision making for assessment and support.  Case was discussed on Rounds with the Respiratory Therapy Staff  Allyne Gee, MD [facility] Pulmonary Critical Care Medicine Sleep Medicine

## 2019-06-11 DIAGNOSIS — N170 Acute kidney failure with tubular necrosis: Secondary | ICD-10-CM | POA: Diagnosis not present

## 2019-06-11 DIAGNOSIS — J9621 Acute and chronic respiratory failure with hypoxia: Secondary | ICD-10-CM | POA: Diagnosis not present

## 2019-06-11 DIAGNOSIS — A419 Sepsis, unspecified organism: Secondary | ICD-10-CM | POA: Diagnosis not present

## 2019-06-11 DIAGNOSIS — J189 Pneumonia, unspecified organism: Secondary | ICD-10-CM | POA: Diagnosis not present

## 2019-06-11 LAB — BASIC METABOLIC PANEL
Anion gap: 11 (ref 5–15)
BUN: 106 mg/dL — ABNORMAL HIGH (ref 6–20)
CO2: 25 mmol/L (ref 22–32)
Calcium: 9.1 mg/dL (ref 8.9–10.3)
Chloride: 113 mmol/L — ABNORMAL HIGH (ref 98–111)
Creatinine, Ser: 1.04 mg/dL — ABNORMAL HIGH (ref 0.44–1.00)
GFR calc Af Amer: >60 mL/min (ref >60)
GFR calc non Af Amer: >60 mL/min (ref >60)
Glucose, Bld: 116 mg/dL — ABNORMAL HIGH (ref 70–99)
Potassium: 3.5 mmol/L (ref 3.5–5.1)
Sodium: 149 mmol/L — ABNORMAL HIGH (ref 135–145)

## 2019-06-11 LAB — CBC
HCT: 24.2 % — ABNORMAL LOW (ref 36.0–46.0)
Hemoglobin: 7.6 g/dL — ABNORMAL LOW (ref 12.0–15.0)
MCH: 29.7 pg (ref 26.0–34.0)
MCHC: 31.4 g/dL (ref 30.0–36.0)
MCV: 94.5 fL (ref 80.0–100.0)
Platelets: 452 10*3/uL — ABNORMAL HIGH (ref 150–400)
RBC: 2.56 MIL/uL — ABNORMAL LOW (ref 3.87–5.11)
RDW: 19.1 % — ABNORMAL HIGH (ref 11.5–15.5)
WBC: 10.3 10*3/uL (ref 4.0–10.5)
nRBC: 0.2 % (ref 0.0–0.2)

## 2019-06-11 NOTE — Progress Notes (Signed)
Pulmonary Critical Care Medicine [place]   PULMONARY CRITICAL CARE SERVICE  PROGRESS NOTE  Date of Service: 06/11/2019  Cristina Norris  [id number]:[id number]  DOB: Dec 07, 1965   DOA: 06/01/2019  Referring Physician: Merton Border, MD  HPI: Cristina Norris is a 53 y.o. female seen for follow up of Acute on Chronic Respiratory Failure.  The patient is on currently on T collar has been weaning goal is for 20 hours right now is on 28% FiO2  Medications: Reviewed on Rounds  Physical Exam:  Vitals: Temperature 97.4 pulse 65 respiratory rate is 12 blood pressure 115/68 saturations 100%  Ventilator Settings off the ventilator right now on T collar FiO2 28%  . General: Comfortable at this time . Eyes: Grossly normal lids, irises & conjunctiva . ENT: grossly tongue is normal . Neck: no obvious mass . Cardiovascular: S1 S2 normal no gallop . Respiratory: No rhonchi rales are noted at this time . Abdomen: soft . Skin: no rash seen on limited exam . Musculoskeletal: not rigid . Psychiatric:unable to assess . Neurologic: no seizure no involuntary movements         Lab Data:   Basic Metabolic Panel: Recent Labs  Lab 06/04/19 2251 06/11/19 0416  NA 140 149*  K 4.5 3.5  CL 110 113*  CO2 19* 25  GLUCOSE 151* 116*  BUN 103* 106*  CREATININE 1.91* 1.04*  CALCIUM 8.8* 9.1    ABG: Recent Labs  Lab 06/05/19 0331  PHART 7.358  PCO2ART 41.4  PO2ART 122*  HCO3 22.9  O2SAT 98.6    Liver Function Tests: Recent Labs  Lab 06/04/19 2251  AST 22  ALT 16  ALKPHOS 82  BILITOT 0.1*  PROT 4.9*  ALBUMIN 1.1*   No results for input(s): LIPASE, AMYLASE in the last 168 hours. No results for input(s): AMMONIA in the last 168 hours.  CBC: Recent Labs  Lab 06/11/19 0416  WBC 10.3  HGB 7.6*  HCT 24.2*  MCV 94.5  PLT 452*    Cardiac Enzymes: No results for input(s): CKTOTAL, CKMB, CKMBINDEX, TROPONINI in the last 168 hours.  BNP (last 3 results) Recent  Labs    07/06/18 0628  BNP 1,109.4*    ProBNP (last 3 results) No results for input(s): PROBNP in the last 8760 hours.  Radiological Exams: No results found.  Assessment/Plan Active Problems:   Acute on chronic respiratory failure with hypoxia (HCC)   Acute tubular necrosis (HCC)   Chronic atrial fibrillation with rapid ventricular response (HCC)   Severe sepsis (HCC)   Healthcare-associated pneumonia   1. Acute on chronic respiratory failure hypoxia plan is to continue with T collar weans as tolerated continue secretion management supportive care 2. Acute tubular necrosis following labs 3. Chronic atrial fibrillation rate controlled 4. Severe sepsis hemodynamics are stable resolved 5. Healthcare associated pneumonia treated continue with supportive care   I have personally seen and evaluated the patient, evaluated laboratory and imaging results, formulated the assessment and plan and placed orders. The Patient requires high complexity decision making for assessment and support.  Case was discussed on Rounds with the Respiratory Therapy Staff  Allyne Gee, MD [facility] Pulmonary Critical Care Medicine Sleep Medicine

## 2019-06-12 DIAGNOSIS — J189 Pneumonia, unspecified organism: Secondary | ICD-10-CM | POA: Diagnosis not present

## 2019-06-12 DIAGNOSIS — N170 Acute kidney failure with tubular necrosis: Secondary | ICD-10-CM | POA: Diagnosis not present

## 2019-06-12 DIAGNOSIS — J9621 Acute and chronic respiratory failure with hypoxia: Secondary | ICD-10-CM | POA: Diagnosis not present

## 2019-06-12 DIAGNOSIS — A419 Sepsis, unspecified organism: Secondary | ICD-10-CM | POA: Diagnosis not present

## 2019-06-12 NOTE — Progress Notes (Signed)
Pulmonary Critical Care Medicine [place]   PULMONARY CRITICAL CARE SERVICE  PROGRESS NOTE  Date of Service: 06/12/2019  Cristina Norris  [id number]  DOB: Dec 25, 1965   DOA: 06/01/2019  Referring Physician: Merton Border, MD  HPI: Cristina Norris is a 53 y.o. female seen for follow up of Acute on Chronic Respiratory Failure.  Patient is off the ventilator on T collar the goal today is for 20 hours  Medications: Reviewed on Rounds  Physical Exam:  Vitals: Temperature 97.0 pulse 67 respiratory rate 11 blood pressure 120/74 saturations 100%  Ventilator Settings off the ventilator on T collar currently on 20% FiO2  . General: Comfortable at this time . Eyes: Grossly normal lids, irises & conjunctiva . ENT: grossly tongue is normal . Neck: no obvious mass . Cardiovascular: S1 S2 normal no gallop . Respiratory: No rhonchi no rales are noted at this time . Abdomen: soft . Skin: no rash seen on limited exam . Musculoskeletal: not rigid . Psychiatric:unable to assess . Neurologic: no seizure no involuntary movements         Lab Data:   Basic Metabolic Panel: Recent Labs  Lab 06/11/19 0416  NA 149*  K 3.5  CL 113*  CO2 25  GLUCOSE 116*  BUN 106*  CREATININE 1.04*  CALCIUM 9.1    ABG: No results for input(s): PHART, PCO2ART, PO2ART, HCO3, O2SAT in the last 168 hours.  Liver Function Tests: No results for input(s): AST, ALT, ALKPHOS, BILITOT, PROT, ALBUMIN in the last 168 hours. No results for input(s): LIPASE, AMYLASE in the last 168 hours. No results for input(s): AMMONIA in the last 168 hours.  CBC: Recent Labs  Lab 06/11/19 0416  WBC 10.3  HGB 7.6*  HCT 24.2*  MCV 94.5  PLT 452*    Cardiac Enzymes: No results for input(s): CKTOTAL, CKMB, CKMBINDEX, TROPONINI in the last 168 hours.  BNP (last 3 results) Recent Labs    07/06/18 0628  BNP 1,109.4*    ProBNP (last 3 results) No results for input(s): PROBNP in the last  8760 hours.  Radiological Exams: No results found.  Assessment/Plan Active Problems:   Acute on chronic respiratory failure with hypoxia (HCC)   Acute tubular necrosis (HCC)   Chronic atrial fibrillation with rapid ventricular response (HCC)   Severe sepsis (HCC)   Healthcare-associated pneumonia   1. Acute on chronic respiratory failure with hypoxia continue O2 collar trials continue secretion management supportive care 2. Acute tubular necrosis continue to follow labs 3. Chronic atrial fibrillation rate controlled 4. Severe sepsis continue with supportive care 5. Healthcare associated pneumonia treated   I have personally seen and evaluated the patient, evaluated laboratory and imaging results, formulated the assessment and plan and placed orders. The Patient requires high complexity decision making for assessment and support.  Case was discussed on Rounds with the Respiratory Therapy Staff  Allyne Gee, MD [facility] Pulmonary Critical Care Medicine Sleep Medicine

## 2019-06-13 DIAGNOSIS — J189 Pneumonia, unspecified organism: Secondary | ICD-10-CM | POA: Diagnosis not present

## 2019-06-13 DIAGNOSIS — N170 Acute kidney failure with tubular necrosis: Secondary | ICD-10-CM | POA: Diagnosis not present

## 2019-06-13 DIAGNOSIS — J9621 Acute and chronic respiratory failure with hypoxia: Secondary | ICD-10-CM | POA: Diagnosis not present

## 2019-06-13 DIAGNOSIS — A419 Sepsis, unspecified organism: Secondary | ICD-10-CM | POA: Diagnosis not present

## 2019-06-13 NOTE — Progress Notes (Signed)
Pulmonary Critical Care Medicine [place]   PULMONARY CRITICAL CARE SERVICE  PROGRESS NOTE  Date of Service: 06/13/2019  Cristina Norris  [id number]:[id number]  DOB: [birth year]   DOA: 06/01/2019  Referring Physician: Merton Border, MD  HPI: Cristina Norris is a 53 y.o. female seen for follow up of Acute on Chronic Respiratory Failure.  Patient is on T collar completed 24 hours no distress is noted at this time right now is on 28% FiO2  Medications: Reviewed on Rounds  Physical Exam:  Vitals: Temperature 97.5 pulse 81 respiratory rate 10 blood pressure is 175/91 saturations 100percent  Ventilator Settings off the ventilator on T collar currently on 28% FiO2  . General: Comfortable at this time . Eyes: Grossly normal lids, irises & conjunctiva . ENT: grossly tongue is normal . Neck: no obvious mass . Cardiovascular: S1 S2 normal no gallop . Respiratory: No rhonchi no rales are noted at this time . Abdomen: soft . Skin: no rash seen on limited exam . Musculoskeletal: not rigid . Psychiatric:unable to assess . Neurologic: no seizure no involuntary movements         Lab Data:   Basic Metabolic Panel: Recent Labs  Lab 06/11/19 0416  NA 149*  K 3.5  CL 113*  CO2 25  GLUCOSE 116*  BUN 106*  CREATININE 1.04*  CALCIUM 9.1    ABG: No results for input(s): PHART, PCO2ART, PO2ART, HCO3, O2SAT in the last 168 hours.  Liver Function Tests: No results for input(s): AST, ALT, ALKPHOS, BILITOT, PROT, ALBUMIN in the last 168 hours. No results for input(s): LIPASE, AMYLASE in the last 168 hours. No results for input(s): AMMONIA in the last 168 hours.  CBC: Recent Labs  Lab 06/11/19 0416  WBC 10.3  HGB 7.6*  HCT 24.2*  MCV 94.5  PLT 452*    Cardiac Enzymes: No results for input(s): CKTOTAL, CKMB, CKMBINDEX, TROPONINI in the last 168 hours.  BNP (last 3 results) Recent Labs    07/06/18 0628  BNP 1,109.4*    ProBNP (last 3 results) No results  for input(s): PROBNP in the last 8760 hours.  Radiological Exams: No results found.  Assessment/Plan Active Problems:   Acute on chronic respiratory failure with hypoxia (HCC)   Acute tubular necrosis (HCC)   Chronic atrial fibrillation with rapid ventricular response (HCC)   Severe sepsis (HCC)   Healthcare-associated pneumonia   1. Acute on chronic respiratory failure hypoxia plan is to continue with T collar trials the goal is 24 hours 2. Acute tubular necrosis no changes noted we will continue with present management 3. Chronic atrial fibrillation rate controlled 4. Severe sepsis resolved 5. Healthcare associated pneumonia treated clinically improved   I have personally seen and evaluated the patient, evaluated laboratory and imaging results, formulated the assessment and plan and placed orders. The Patient requires high complexity decision making for assessment and support.  Case was discussed on Rounds with the Respiratory Therapy Staff  Allyne Gee, MD [facility] Pulmonary Critical Care Medicine Sleep Medicine

## 2019-06-14 DIAGNOSIS — J9621 Acute and chronic respiratory failure with hypoxia: Secondary | ICD-10-CM | POA: Diagnosis not present

## 2019-06-14 DIAGNOSIS — A419 Sepsis, unspecified organism: Secondary | ICD-10-CM | POA: Diagnosis not present

## 2019-06-14 DIAGNOSIS — N170 Acute kidney failure with tubular necrosis: Secondary | ICD-10-CM | POA: Diagnosis not present

## 2019-06-14 DIAGNOSIS — J189 Pneumonia, unspecified organism: Secondary | ICD-10-CM | POA: Diagnosis not present

## 2019-06-14 NOTE — Progress Notes (Signed)
Pulmonary Critical Care Medicine [place]   PULMONARY CRITICAL CARE SERVICE  PROGRESS NOTE  Date of Service: 06/14/2019  Cristina Norris  [id number]  DOB: April 23, 1966   DOA: 06/01/2019  Referring Physician: Merton Border, MD  HPI: Cristina Norris is a 53 y.o. female seen for follow up of Acute on Chronic Respiratory Failure.  Patient currently is on T collar has been on 28% FiO2 has a #6 cuffed trach in place and this can be switched out today.  The plan is going to be to go ahead and change out the trach today  Medications: Reviewed on Rounds  Physical Exam:  Vitals: Temperature 97.3 pulse 98 respiratory rate is 13 blood pressure 150/81 saturations 100%  Ventilator Settings off the ventilator on T collar currently  . General: Comfortable at this time . Eyes: Grossly normal lids, irises & conjunctiva . ENT: grossly tongue is normal . Neck: no obvious mass . Cardiovascular: S1 S2 normal no gallop . Respiratory: No rhonchi no rales are noted at this time . Abdomen: soft . Skin: no rash seen on limited exam . Musculoskeletal: not rigid . Psychiatric:unable to assess . Neurologic: no seizure no involuntary movements         Lab Data:   Basic Metabolic Panel: Recent Labs  Lab 06/11/19 0416  NA 149*  K 3.5  CL 113*  CO2 25  GLUCOSE 116*  BUN 106*  CREATININE 1.04*  CALCIUM 9.1    ABG: No results for input(s): PHART, PCO2ART, PO2ART, HCO3, O2SAT in the last 168 hours.  Liver Function Tests: No results for input(s): AST, ALT, ALKPHOS, BILITOT, PROT, ALBUMIN in the last 168 hours. No results for input(s): LIPASE, AMYLASE in the last 168 hours. No results for input(s): AMMONIA in the last 168 hours.  CBC: Recent Labs  Lab 06/11/19 0416  WBC 10.3  HGB 7.6*  HCT 24.2*  MCV 94.5  PLT 452*    Cardiac Enzymes: No results for input(s): CKTOTAL, CKMB, CKMBINDEX, TROPONINI in the last 168 hours.  BNP (last 3 results) Recent Labs     07/06/18 0628  BNP 1,109.4*    ProBNP (last 3 results) No results for input(s): PROBNP in the last 8760 hours.  Radiological Exams: No results found.  Assessment/Plan Active Problems:   Acute on chronic respiratory failure with hypoxia (HCC)   Acute tubular necrosis (HCC)   Chronic atrial fibrillation with rapid ventricular response (HCC)   Severe sepsis (HCC)   Healthcare-associated pneumonia   1. Acute on chronic respiratory failure with hypoxia the patient is doing well on T collar and has shown significant improvement.  She does need to have the tracheostomy changed now we'll proceed to changing out to a #6 cuffless trach I have gone ahead and written the order for this to be done today 2. Acute tubular necrosis clinically improved we'll continue with supportive care 3. Chronic atrial fibrillation rate is controlled 4. Severe sepsis resolved hemodynamics are stable we'll continue to monitor 5. Healthcare associated pneumonia treated and is improved clinically we'll follow-up radiologically   I have personally seen and evaluated the patient, evaluated laboratory and imaging results, formulated the assessment and plan and placed orders. The Patient requires high complexity decision making for assessment and support.  Rounds were done with the Respiratory Therapy Director and Staff therapists and discussed with nursing staff also.  Allyne Gee, MD [facility] Pulmonary Critical Care Medicine Sleep Medicine

## 2019-06-15 DIAGNOSIS — J189 Pneumonia, unspecified organism: Secondary | ICD-10-CM | POA: Diagnosis not present

## 2019-06-15 DIAGNOSIS — J9621 Acute and chronic respiratory failure with hypoxia: Secondary | ICD-10-CM | POA: Diagnosis not present

## 2019-06-15 DIAGNOSIS — A419 Sepsis, unspecified organism: Secondary | ICD-10-CM | POA: Diagnosis not present

## 2019-06-15 DIAGNOSIS — N170 Acute kidney failure with tubular necrosis: Secondary | ICD-10-CM | POA: Diagnosis not present

## 2019-06-15 LAB — BASIC METABOLIC PANEL
Anion gap: 8 (ref 5–15)
BUN: 93 mg/dL — ABNORMAL HIGH (ref 6–20)
CO2: 27 mmol/L (ref 22–32)
Calcium: 8.9 mg/dL (ref 8.9–10.3)
Chloride: 111 mmol/L (ref 98–111)
Creatinine, Ser: 0.9 mg/dL (ref 0.44–1.00)
GFR calc Af Amer: >60 mL/min (ref >60)
GFR calc non Af Amer: >60 mL/min (ref >60)
Glucose, Bld: 103 mg/dL — ABNORMAL HIGH (ref 70–99)
Potassium: 3.4 mmol/L — ABNORMAL LOW (ref 3.5–5.1)
Sodium: 146 mmol/L — ABNORMAL HIGH (ref 135–145)

## 2019-06-15 LAB — MAGNESIUM: Magnesium: 1.6 mg/dL — ABNORMAL LOW (ref 1.7–2.4)

## 2019-06-15 NOTE — Progress Notes (Signed)
Pulmonary Critical Care Medicine [place]   PULMONARY CRITICAL CARE SERVICE  PROGRESS NOTE  Date of Service: 06/15/2019  Cristina Norris  FAO:[id number]  DOB: January 22, 1966   DOA: 06/01/2019  Referring Physician: Merton Border, MD  HPI: Cristina Norris is a 53 y.o. female seen for follow up of Acute on Chronic Respiratory Failure.  Patient currently is on T collar is on 20% FiO2 with good saturations noted.  Medications: Reviewed on Rounds  Physical Exam:  Vitals: Temperature is 97.9 pulse 75 respiratory 15 blood pressure is 124/72 saturations 100%  Ventilator Settings off the ventilator right now on T collar  . General: Comfortable at this time . Eyes: Grossly normal lids, irises & conjunctiva . ENT: grossly tongue is normal . Neck: no obvious mass . Cardiovascular: S1 S2 normal no gallop . Respiratory: No rhonchi no rales are noted at this time . Abdomen: soft . Skin: no rash seen on limited exam . Musculoskeletal: not rigid . Psychiatric:unable to assess . Neurologic: no seizure no involuntary movements         Lab Data:   Basic Metabolic Panel: Recent Labs  Lab 06/11/19 0416 06/15/19 0529  NA 149* 146*  K 3.5 3.4*  CL 113* 111  CO2 25 27  GLUCOSE 116* 103*  BUN 106* 93*  CREATININE 1.04* 0.90  CALCIUM 9.1 8.9  MG  --  1.6*    ABG: No results for input(s): PHART, PCO2ART, PO2ART, HCO3, O2SAT in the last 168 hours.  Liver Function Tests: No results for input(s): AST, ALT, ALKPHOS, BILITOT, PROT, ALBUMIN in the last 168 hours. No results for input(s): LIPASE, AMYLASE in the last 168 hours. No results for input(s): AMMONIA in the last 168 hours.  CBC: Recent Labs  Lab 06/11/19 0416  WBC 10.3  HGB 7.6*  HCT 24.2*  MCV 94.5  PLT 452*    Cardiac Enzymes: No results for input(s): CKTOTAL, CKMB, CKMBINDEX, TROPONINI in the last 168 hours.  BNP (last 3 results) Recent Labs    07/06/18 0628  BNP 1,109.4*    ProBNP (last 3  results) No results for input(s): PROBNP in the last 8760 hours.  Radiological Exams: No results found.  Assessment/Plan Active Problems:   Acute on chronic respiratory failure with hypoxia (HCC)   Acute tubular necrosis (HCC)   Chronic atrial fibrillation with rapid ventricular response (HCC)   Severe sepsis (HCC)   Healthcare-associated pneumonia   1. Acute on chronic respiratory failure hypoxia patient is currently on 20% FiO2 on T collar good saturations are noted 2. Acute tubular necrosis we will continue to follow labs improving labs 3. Chronic atrial fibrillation rate is controlled 4. Severe sepsis hemodynamics are stable 5. Healthcare associated pneumonia treated clinically improving   I have personally seen and evaluated the patient, evaluated laboratory and imaging results, formulated the assessment and plan and placed orders. The Patient requires high complexity decision making for assessment and support.  Rounds were done with the Respiratory Therapy Director and Staff therapists and discussed with nursing staff also.  Allyne Gee, MD [facility] Pulmonary Critical Care Medicine Sleep Medicine

## 2019-06-16 DIAGNOSIS — N170 Acute kidney failure with tubular necrosis: Secondary | ICD-10-CM | POA: Diagnosis not present

## 2019-06-16 DIAGNOSIS — J189 Pneumonia, unspecified organism: Secondary | ICD-10-CM | POA: Diagnosis not present

## 2019-06-16 DIAGNOSIS — A419 Sepsis, unspecified organism: Secondary | ICD-10-CM | POA: Diagnosis not present

## 2019-06-16 DIAGNOSIS — J9621 Acute and chronic respiratory failure with hypoxia: Secondary | ICD-10-CM | POA: Diagnosis not present

## 2019-06-16 NOTE — Progress Notes (Signed)
Pulmonary Critical Care Medicine [facility] [place]   PULMONARY CRITICAL CARE SERVICE  PROGRESS NOTE  Date of Service: 06/16/2019  Cristina Norris  ASN:[id number]  DOB: May 06, 1966   DOA: 06/01/2019  Referring Physician: Carron Curie, MD  HPI: Cristina Norris is a 54 y.o. female seen for follow up of Acute on Chronic Respiratory Failure.  Patient is currently on T collar without distress at this time oxygen was decreased down to room air   Medications: Reviewed on Rounds  Physical Exam:  Vitals: Temperature 98.5 pulse 80 respiratory 16 blood pressure 124/75 saturations 100%  Ventilator Settings on T collar room air  . General: Comfortable at this time . Eyes: Grossly normal lids, irises & conjunctiva . ENT: grossly tongue is normal . Neck: no obvious mass . Cardiovascular: S1 S2 normal no gallop . Respiratory: No rhonchi no rales are noted at this time . Abdomen: soft . Skin: no rash seen on limited exam . Musculoskeletal: not rigid . Psychiatric:unable to assess . Neurologic: no seizure no involuntary movements         Lab Data:   Basic Metabolic Panel: Recent Labs  Lab 06/11/19 0416 06/15/19 0529  NA 149* 146*  K 3.5 3.4*  CL 113* 111  CO2 25 27  GLUCOSE 116* 103*  BUN 106* 93*  CREATININE 1.04* 0.90  CALCIUM 9.1 8.9  MG  --  1.6*    ABG: No results for input(s): PHART, PCO2ART, PO2ART, HCO3, O2SAT in the last 168 hours.  Liver Function Tests: No results for input(s): AST, ALT, ALKPHOS, BILITOT, PROT, ALBUMIN in the last 168 hours. No results for input(s): LIPASE, AMYLASE in the last 168 hours. No results for input(s): AMMONIA in the last 168 hours.  CBC: Recent Labs  Lab 06/11/19 0416  WBC 10.3  HGB 7.6*  HCT 24.2*  MCV 94.5  PLT 452*    Cardiac Enzymes: No results for input(s): CKTOTAL, CKMB, CKMBINDEX, TROPONINI in the last 168 hours.  BNP (last 3 results) Recent Labs    07/06/18 0628  BNP 1,109.4*    ProBNP (last 3  results) No results for input(s): PROBNP in the last 8760 hours.  Radiological Exams: No results found.  Assessment/Plan Active Problems:   Acute on chronic respiratory failure with hypoxia (HCC)   Acute tubular necrosis (HCC)   Chronic atrial fibrillation with rapid ventricular response (HCC)   Severe sepsis (HCC)   Healthcare-associated pneumonia   1. Acute on chronic respiratory failure hypoxia patient continues on 28% FiO2 down to 21% 2. ATN treated resolved 3. Chronic atrial fibrillation rate controlled 4. Severe sepsis resolving 5. Healthcare associated pneumonia treated we will continue with present management   I have personally seen and evaluated the patient, evaluated laboratory and imaging results, formulated the assessment and plan and placed orders. The Patient requires high complexity decision making for assessment and support.  Rounds were done with the Respiratory Therapy Director and Staff therapists and discussed with nursing staff also.  Yevonne Pax, MD [facility] Pulmonary Critical Care Medicine Sleep Medicine

## 2019-06-17 DIAGNOSIS — A419 Sepsis, unspecified organism: Secondary | ICD-10-CM | POA: Diagnosis not present

## 2019-06-17 DIAGNOSIS — J9621 Acute and chronic respiratory failure with hypoxia: Secondary | ICD-10-CM | POA: Diagnosis not present

## 2019-06-17 DIAGNOSIS — J189 Pneumonia, unspecified organism: Secondary | ICD-10-CM | POA: Diagnosis not present

## 2019-06-17 DIAGNOSIS — N170 Acute kidney failure with tubular necrosis: Secondary | ICD-10-CM | POA: Diagnosis not present

## 2019-06-17 NOTE — Progress Notes (Addendum)
Pulmonary Critical Care Medicine [facility] [place]   PULMONARY CRITICAL CARE SERVICE  PROGRESS NOTE  Date of Service: 06/17/2019  Cristina Norris  CHY:[id number]  DOB: Mar 12, 1966   DOA: 06/01/2019  Referring Physician: Carron Curie, MD  HPI: Cristina Norris is a 54 y.o. female seen for follow up of Acute on Chronic Respiratory Failure.  Patient is currently on T collar has been on room air doing fairly well.  This is her baseline  Medications: Reviewed on Rounds  Physical Exam:  Vitals: Temperature 97.1 pulse 85 respiratory rate 11 blood pressure is 146/82 saturations 99%  Ventilator Settings currently on T collar has been on room air  . General: Comfortable at this time . Eyes: Grossly normal lids, irises & conjunctiva . ENT: grossly tongue is normal . Neck: no obvious mass . Cardiovascular: S1 S2 normal no gallop . Respiratory: No rhonchi no rales are noted at this time . Abdomen: soft . Skin: no rash seen on limited exam . Musculoskeletal: not rigid . Psychiatric:unable to assess . Neurologic: no seizure no involuntary movements         Lab Data:   Basic Metabolic Panel: Recent Labs  Lab 06/11/19 0416 06/15/19 0529  NA 149* 146*  K 3.5 3.4*  CL 113* 111  CO2 25 27  GLUCOSE 116* 103*  BUN 106* 93*  CREATININE 1.04* 0.90  CALCIUM 9.1 8.9  MG  --  1.6*    ABG: No results for input(s): PHART, PCO2ART, PO2ART, HCO3, O2SAT in the last 168 hours.  Liver Function Tests: No results for input(s): AST, ALT, ALKPHOS, BILITOT, PROT, ALBUMIN in the last 168 hours. No results for input(s): LIPASE, AMYLASE in the last 168 hours. No results for input(s): AMMONIA in the last 168 hours.  CBC: Recent Labs  Lab 06/11/19 0416  WBC 10.3  HGB 7.6*  HCT 24.2*  MCV 94.5  PLT 452*    Cardiac Enzymes: No results for input(s): CKTOTAL, CKMB, CKMBINDEX, TROPONINI in the last 168 hours.  BNP (last 3 results) Recent Labs    07/06/18 0628  BNP 1,109.4*     ProBNP (last 3 results) No results for input(s): PROBNP in the last 8760 hours.  Radiological Exams: No results found.  Assessment/Plan Active Problems:   Acute on chronic respiratory failure with hypoxia (HCC)   Acute tubular necrosis (HCC)   Chronic atrial fibrillation with rapid ventricular response (HCC)   Severe sepsis (HCC)   Healthcare-associated pneumonia   1. Acute on chronic respiratory failure with hypoxia patient continues with T collar trials will continue secretion management supportive care. 2. Acute tubular necrosis resolved we will continue with supportive care 3. Chronic atrial fibrillation rate controlled 4. Severe sepsis hemodynamics are stable 5. Healthcare associated pneumonia treated clinically is improved   I have personally seen and evaluated the patient, evaluated laboratory and imaging results, formulated the assessment and plan and placed orders. The Patient requires high complexity decision making with multiple systems involvement.  Rounds were done with the Respiratory Therapy Director and Staff therapists and discussed with nursing staff also.  Yevonne Pax, MD [facility] Pulmonary Critical Care Medicine Sleep Medicine

## 2019-06-18 ENCOUNTER — Other Ambulatory Visit (HOSPITAL_COMMUNITY): Payer: Medicare Other

## 2019-06-18 DIAGNOSIS — J9621 Acute and chronic respiratory failure with hypoxia: Secondary | ICD-10-CM | POA: Diagnosis not present

## 2019-06-18 DIAGNOSIS — I482 Chronic atrial fibrillation, unspecified: Secondary | ICD-10-CM | POA: Diagnosis not present

## 2019-06-18 DIAGNOSIS — J189 Pneumonia, unspecified organism: Secondary | ICD-10-CM | POA: Diagnosis not present

## 2019-06-18 DIAGNOSIS — R0902 Hypoxemia: Secondary | ICD-10-CM

## 2019-06-18 DIAGNOSIS — N170 Acute kidney failure with tubular necrosis: Secondary | ICD-10-CM | POA: Diagnosis not present

## 2019-06-18 LAB — CBC
HCT: 25.7 % — ABNORMAL LOW (ref 36.0–46.0)
Hemoglobin: 8.1 g/dL — ABNORMAL LOW (ref 12.0–15.0)
MCH: 30.5 pg (ref 26.0–34.0)
MCHC: 31.5 g/dL (ref 30.0–36.0)
MCV: 96.6 fL (ref 80.0–100.0)
Platelets: 685 10*3/uL — ABNORMAL HIGH (ref 150–400)
RBC: 2.66 MIL/uL — ABNORMAL LOW (ref 3.87–5.11)
RDW: 18.6 % — ABNORMAL HIGH (ref 11.5–15.5)
WBC: 22.9 10*3/uL — ABNORMAL HIGH (ref 4.0–10.5)
nRBC: 0.2 % (ref 0.0–0.2)

## 2019-06-18 LAB — BLOOD GAS, ARTERIAL
Acid-Base Excess: 2.2 mmol/L — ABNORMAL HIGH (ref 0.0–2.0)
Bicarbonate: 27.3 mmol/L (ref 20.0–28.0)
FIO2: 60
O2 Saturation: 86.1 %
Patient temperature: 37
pCO2 arterial: 51.1 mmHg — ABNORMAL HIGH (ref 32.0–48.0)
pH, Arterial: 7.347 — ABNORMAL LOW (ref 7.350–7.450)
pO2, Arterial: 58.7 mmHg — ABNORMAL LOW (ref 83.0–108.0)

## 2019-06-18 LAB — BASIC METABOLIC PANEL
Anion gap: 11 (ref 5–15)
BUN: 86 mg/dL — ABNORMAL HIGH (ref 6–20)
CO2: 24 mmol/L (ref 22–32)
Calcium: 9.3 mg/dL (ref 8.9–10.3)
Chloride: 105 mmol/L (ref 98–111)
Creatinine, Ser: 0.8 mg/dL (ref 0.44–1.00)
GFR calc Af Amer: >60 mL/min (ref >60)
GFR calc non Af Amer: >60 mL/min (ref >60)
Glucose, Bld: 206 mg/dL — ABNORMAL HIGH (ref 70–99)
Potassium: 3.6 mmol/L (ref 3.5–5.1)
Sodium: 140 mmol/L (ref 135–145)

## 2019-06-18 LAB — CULTURE, RESPIRATORY W GRAM STAIN

## 2019-06-18 LAB — MAGNESIUM: Magnesium: 1.8 mg/dL (ref 1.7–2.4)

## 2019-06-18 MED ORDER — GENERIC EXTERNAL MEDICATION
Status: DC
Start: ? — End: 2019-06-18

## 2019-06-18 NOTE — Progress Notes (Signed)
Pulmonary Critical Care Medicine [place]   PULMONARY CRITICAL CARE SERVICE  PROGRESS NOTE  Date of Service: 06/18/2019  MCKAY BRANDT  ZOX:[id number]  DOB: Mar 10, 1966   DOA: 06/01/2019  Referring Physician: Merton Border, MD  HPI: MACKENZEE BECVAR is a 54 y.o. female seen for follow up of Acute on Chronic Respiratory Failure.  Patient is currently on T collar has been on 60% FiO2 her work of breathing has been noted to be increased and drop in oxygen saturations.  Chest x-ray was done the chest x-ray shows worsening infiltrates I compared the films from the previous film done in December and there is significant worsening.  Patient has been started on antibiotics also  Medications: Reviewed on Rounds  Physical Exam:  Vitals: Temperature 96.4 pulse 104 respiratory 26 blood pressure 180/107 saturations 90%  Ventilator Settings on T collar FiO2 of 60% with saturation of 90%  . General: Comfortable at this time . Eyes: Grossly normal lids, irises & conjunctiva . ENT: grossly tongue is normal . Neck: no obvious mass . Cardiovascular: S1 S2 normal no gallop . Respiratory: Coarse rhonchi noted right greater than left . Abdomen: soft . Skin: no rash seen on limited exam . Musculoskeletal: not rigid . Psychiatric:unable to assess . Neurologic: no seizure no involuntary movements         Lab Data:   Basic Metabolic Panel: Recent Labs  Lab 06/15/19 0529 06/18/19 1221  NA 146* 140  K 3.4* 3.6  CL 111 105  CO2 27 24  GLUCOSE 103* 206*  BUN 93* 86*  CREATININE 0.90 0.80  CALCIUM 8.9 9.3  MG 1.6* 1.8    ABG: Recent Labs  Lab 06/18/19 1233  PHART 7.347*  PCO2ART 51.1*  PO2ART 58.7*  HCO3 27.3  O2SAT 86.1    Liver Function Tests: No results for input(s): AST, ALT, ALKPHOS, BILITOT, PROT, ALBUMIN in the last 168 hours. No results for input(s): LIPASE, AMYLASE in the last 168 hours. No results for input(s): AMMONIA in the last 168  hours.  CBC: Recent Labs  Lab 06/18/19 1221  WBC 22.9*  HGB 8.1*  HCT 25.7*  MCV 96.6  PLT 685*    Cardiac Enzymes: No results for input(s): CKTOTAL, CKMB, CKMBINDEX, TROPONINI in the last 168 hours.  BNP (last 3 results) Recent Labs    07/06/18 0628  BNP 1,109.4*    ProBNP (last 3 results) No results for input(s): PROBNP in the last 8760 hours.  Radiological Exams: DG Chest Port 1 View  Result Date: 06/18/2019 CLINICAL DATA:  Oxygen desaturation EXAM: PORTABLE CHEST 1 VIEW COMPARISON:  06/05/2019 FINDINGS: Tracheostomy tube, gastric catheter and right-sided PICC line are again seen and stable. Cardiac shadow is within normal limits. Patchy infiltrates are noted in the bases bilaterally consistent with acute infiltrate. Small pleural effusions are noted bilaterally. No bony abnormality is seen. IMPRESSION: Increasing bibasilar infiltrates with associated effusions. Tubes and lines as described. Electronically Signed   By: Inez Catalina M.D.   On: 06/18/2019 12:43    Assessment/Plan Active Problems:   Acute on chronic respiratory failure with hypoxia (HCC)   Acute tubular necrosis (HCC)   Chronic atrial fibrillation with rapid ventricular response (HCC)   Severe sepsis (HCC)   Healthcare-associated pneumonia   1. Acute on chronic respiratory failure hypoxia patient has had acute decompensation chest x-ray looks like worsening infiltrate pneumonia versus CHF however the infiltrate is acute.  The patient I feel like he is going to be decompensating  so therefore we will preemptively place her back on the ventilator.  In addition she did have an ABG done which showed significant hypoxia with a PO2 of 58 which would support placing her back on resting mode or pressure support which ever she is comfortable on 2. Healthcare associated pneumonia chest x-ray looks like it is worsening patient will be started on antibiotics discussed with the primary care team on rounds also would  monitor her fluid status closely 3. Acute renal failure follow-up labs stabilized 4. Severe sepsis right now hemodynamics are stable we will continue with present management 5. Chronic atrial fibrillation currently rate is controlled we will continue to follow along   I have personally seen and evaluated the patient, evaluated laboratory and imaging results, formulated the assessment and plan and placed orders. The Patient requires high complexity decision making with multiple systems involvement.  Rounds were done with the Respiratory Therapy Director and Staff therapists and discussed with nursing staff also.  Time 35 minutes patient has had an acute decline in status  Yevonne Pax, MD [facility] Pulmonary Critical Care Medicine Sleep Medicine

## 2019-06-19 DIAGNOSIS — J9621 Acute and chronic respiratory failure with hypoxia: Secondary | ICD-10-CM | POA: Diagnosis not present

## 2019-06-19 DIAGNOSIS — N170 Acute kidney failure with tubular necrosis: Secondary | ICD-10-CM | POA: Diagnosis not present

## 2019-06-19 DIAGNOSIS — J189 Pneumonia, unspecified organism: Secondary | ICD-10-CM | POA: Diagnosis not present

## 2019-06-19 DIAGNOSIS — I482 Chronic atrial fibrillation, unspecified: Secondary | ICD-10-CM | POA: Diagnosis not present

## 2019-06-19 NOTE — Progress Notes (Addendum)
Pulmonary Critical Care Medicine [facility] [place]   PULMONARY CRITICAL CARE SERVICE  PROGRESS NOTE  Date of Service: 06/19/2019  Cristina Norris  ZOX:[id number]  DOB: February 03, 1966   DOA: 06/01/2019  Referring Physician: Carron Curie, MD  HPI: Cristina Norris is a 54 y.o. female seen for follow up of Acute on Chronic Respiratory Failure.  Patient is currently on T collar on 35% FiO2 the resting mode has been pressure support right now is doing well on back on T collar.  Yesterday the patient had to be placed on the ventilator because of acute decompensation.  It appears that now she is doing better so we will try to advance her on weaning protocol.  Medications: Reviewed on Rounds  Physical Exam:  Vitals: Temperature 97.1 pulse 86 respirate 16 blood pressure 122/76 saturations are 98%  Ventilator Settings off the ventilator again on T collar FiO2 is 35% we will going to try 2 hours  . General: Comfortable at this time . Eyes: Grossly normal lids, irises & conjunctiva . ENT: grossly tongue is normal . Neck: no obvious mass . Cardiovascular: S1 S2 normal no gallop . Respiratory: Coarse breath sounds with a few scattered rhonchi . Abdomen: soft . Skin: no rash seen on limited exam . Musculoskeletal: not rigid . Psychiatric:unable to assess . Neurologic: no seizure no involuntary movements         Lab Data:   Basic Metabolic Panel: Recent Labs  Lab 06/15/19 0529 06/18/19 1221  NA 146* 140  K 3.4* 3.6  CL 111 105  CO2 27 24  GLUCOSE 103* 206*  BUN 93* 86*  CREATININE 0.90 0.80  CALCIUM 8.9 9.3  MG 1.6* 1.8    ABG: Recent Labs  Lab 06/18/19 1233  PHART 7.347*  PCO2ART 51.1*  PO2ART 58.7*  HCO3 27.3  O2SAT 86.1    Liver Function Tests: No results for input(s): AST, ALT, ALKPHOS, BILITOT, PROT, ALBUMIN in the last 168 hours. No results for input(s): LIPASE, AMYLASE in the last 168 hours. No results for input(s): AMMONIA in the last 168  hours.  CBC: Recent Labs  Lab 06/18/19 1221  WBC 22.9*  HGB 8.1*  HCT 25.7*  MCV 96.6  PLT 685*    Cardiac Enzymes: No results for input(s): CKTOTAL, CKMB, CKMBINDEX, TROPONINI in the last 168 hours.  BNP (last 3 results) Recent Labs    07/06/18 0628  BNP 1,109.4*    ProBNP (last 3 results) No results for input(s): PROBNP in the last 8760 hours.  Radiological Exams: DG Chest Port 1 View  Result Date: 06/18/2019 CLINICAL DATA:  Oxygen desaturation EXAM: PORTABLE CHEST 1 VIEW COMPARISON:  06/05/2019 FINDINGS: Tracheostomy tube, gastric catheter and right-sided PICC line are again seen and stable. Cardiac shadow is within normal limits. Patchy infiltrates are noted in the bases bilaterally consistent with acute infiltrate. Small pleural effusions are noted bilaterally. No bony abnormality is seen. IMPRESSION: Increasing bibasilar infiltrates with associated effusions. Tubes and lines as described. Electronically Signed   By: Alcide Clever M.D.   On: 06/18/2019 12:43    Assessment/Plan Active Problems:   Acute on chronic respiratory failure with hypoxia (HCC)   Acute tubular necrosis (HCC)   Chronic atrial fibrillation with rapid ventricular response (HCC)   Severe sepsis (HCC)   Healthcare-associated pneumonia   1. Acute on chronic respiratory failure with hypoxia plan is to continue with T collar trials currently is on 35% FiO2 secretions are fair to moderate. 2. Acute liver necrosis  no change we will continue with pulmonary toilet supportive care 3. Chronic atrial fibrillation rate controlled 4. Severe sepsis hemodynamics are stable we will continue with present management 5. Healthcare associated pneumonia treated we will continue to follow along.   I have personally seen and evaluated the patient, evaluated laboratory and imaging results, formulated the assessment and plan and placed orders. The Patient requires high complexity decision making with multiple systems  involvement.  Rounds were done with the Respiratory Therapy Director and Staff therapists and discussed with nursing staff also.  Time is 35 minutes  Allyne Gee, MD [facility] Pulmonary Critical Care Medicine Sleep Medicine

## 2019-06-20 DIAGNOSIS — I482 Chronic atrial fibrillation, unspecified: Secondary | ICD-10-CM | POA: Diagnosis not present

## 2019-06-20 DIAGNOSIS — N170 Acute kidney failure with tubular necrosis: Secondary | ICD-10-CM | POA: Diagnosis not present

## 2019-06-20 DIAGNOSIS — J9621 Acute and chronic respiratory failure with hypoxia: Secondary | ICD-10-CM | POA: Diagnosis not present

## 2019-06-20 DIAGNOSIS — J189 Pneumonia, unspecified organism: Secondary | ICD-10-CM | POA: Diagnosis not present

## 2019-06-20 LAB — BASIC METABOLIC PANEL
Anion gap: 10 (ref 5–15)
BUN: 88 mg/dL — ABNORMAL HIGH (ref 6–20)
CO2: 26 mmol/L (ref 22–32)
Calcium: 8.7 mg/dL — ABNORMAL LOW (ref 8.9–10.3)
Chloride: 105 mmol/L (ref 98–111)
Creatinine, Ser: 0.77 mg/dL (ref 0.44–1.00)
GFR calc Af Amer: >60 mL/min (ref >60)
GFR calc non Af Amer: >60 mL/min (ref >60)
Glucose, Bld: 104 mg/dL — ABNORMAL HIGH (ref 70–99)
Potassium: 4 mmol/L (ref 3.5–5.1)
Sodium: 141 mmol/L (ref 135–145)

## 2019-06-20 LAB — CBC
HCT: 20.9 % — ABNORMAL LOW (ref 36.0–46.0)
Hemoglobin: 6.5 g/dL — CL (ref 12.0–15.0)
MCH: 30.4 pg (ref 26.0–34.0)
MCHC: 31.1 g/dL (ref 30.0–36.0)
MCV: 97.7 fL (ref 80.0–100.0)
Platelets: 517 10*3/uL — ABNORMAL HIGH (ref 150–400)
RBC: 2.14 MIL/uL — ABNORMAL LOW (ref 3.87–5.11)
RDW: 18.6 % — ABNORMAL HIGH (ref 11.5–15.5)
WBC: 15.4 10*3/uL — ABNORMAL HIGH (ref 4.0–10.5)
nRBC: 0.3 % — ABNORMAL HIGH (ref 0.0–0.2)

## 2019-06-20 LAB — ABO/RH: ABO/RH(D): A POS

## 2019-06-20 LAB — MAGNESIUM: Magnesium: 1.6 mg/dL — ABNORMAL LOW (ref 1.7–2.4)

## 2019-06-20 LAB — PREPARE RBC (CROSSMATCH)

## 2019-06-20 NOTE — Progress Notes (Signed)
Pulmonary Critical Care Medicine [facility] [place]   PULMONARY CRITICAL CARE SERVICE  PROGRESS NOTE  Date of Service: 06/20/2019  SUZANE VANDERWEIDE  EGB:[id number]  DOB: Apr 19, 1966   DOA: 06/01/2019  Referring Physician: Carron Curie, MD  HPI: Cristina Norris is a 54 y.o. female seen for follow up of Acute on Chronic Respiratory Failure.  After decompensation over the weekend she is back on the wean protocol now so we have her on T collar and she is tolerating 35% FiO2.  The patient is supposed to do up to 8 hours today on the wean on T collar if successful we will continue to advance as tolerated.  Medications: Reviewed on Rounds  Physical Exam:  Vitals: Temperature 96.1 pulse 82 respiratory rate 16 blood pressure is 107/65 saturations 98%  Ventilator Settings off the ventilator on T collar currently on 35% FiO2  . General: Comfortable at this time . Eyes: Grossly normal lids, irises & conjunctiva . ENT: grossly tongue is normal . Neck: no obvious mass . Cardiovascular: S1 S2 normal no gallop . Respiratory: No rhonchi coarse breath sounds are noted at this time . Abdomen: soft . Skin: no rash seen on limited exam . Musculoskeletal: not rigid . Psychiatric:unable to assess . Neurologic: no seizure no involuntary movements         Lab Data:   Basic Metabolic Panel: Recent Labs  Lab 06/15/19 0529 06/18/19 1221 06/20/19 0612  NA 146* 140 141  K 3.4* 3.6 4.0  CL 111 105 105  CO2 27 24 26   GLUCOSE 103* 206* 104*  BUN 93* 86* 88*  CREATININE 0.90 0.80 0.77  CALCIUM 8.9 9.3 8.7*  MG 1.6* 1.8 1.6*    ABG: Recent Labs  Lab 06/18/19 1233  PHART 7.347*  PCO2ART 51.1*  PO2ART 58.7*  HCO3 27.3  O2SAT 86.1    Liver Function Tests: No results for input(s): AST, ALT, ALKPHOS, BILITOT, PROT, ALBUMIN in the last 168 hours. No results for input(s): LIPASE, AMYLASE in the last 168 hours. No results for input(s): AMMONIA in the last 168  hours.  CBC: Recent Labs  Lab 06/18/19 1221 06/20/19 0612  WBC 22.9* 15.4*  HGB 8.1* 6.5*  HCT 25.7* 20.9*  MCV 96.6 97.7  PLT 685* 517*    Cardiac Enzymes: No results for input(s): CKTOTAL, CKMB, CKMBINDEX, TROPONINI in the last 168 hours.  BNP (last 3 results) Recent Labs    07/06/18 0628  BNP 1,109.4*    ProBNP (last 3 results) No results for input(s): PROBNP in the last 8760 hours.  Radiological Exams: DG Chest Port 1 View  Result Date: 06/18/2019 CLINICAL DATA:  Oxygen desaturation EXAM: PORTABLE CHEST 1 VIEW COMPARISON:  06/05/2019 FINDINGS: Tracheostomy tube, gastric catheter and right-sided PICC line are again seen and stable. Cardiac shadow is within normal limits. Patchy infiltrates are noted in the bases bilaterally consistent with acute infiltrate. Small pleural effusions are noted bilaterally. No bony abnormality is seen. IMPRESSION: Increasing bibasilar infiltrates with associated effusions. Tubes and lines as described. Electronically Signed   By: 06/07/2019 M.D.   On: 06/18/2019 12:43    Assessment/Plan Active Problems:   Acute on chronic respiratory failure with hypoxia (HCC)   Acute tubular necrosis (HCC)   Chronic atrial fibrillation with rapid ventricular response (HCC)   Severe sepsis (HCC)   Healthcare-associated pneumonia   1. Acute on chronic respiratory failure with hypoxia plan is to continue with T collar trials patient currently is on 35% FiO2 with  a goal of 8 hours maximum today. 2. ATN treated clinically is improved 3. Chronic atrial fibrillation rate is controlled 4. Severe sepsis hemodynamics are stable 5. Healthcare associated pneumonia with chest x-ray showing some increased basilar infiltrates we need to monitor this quite closely.  Patient right so far has not had any fevers we will continue to follow along   I have personally seen and evaluated the patient, evaluated laboratory and imaging results, formulated the assessment and  plan and placed orders. The Patient requires high complexity decision making with multiple systems involvement.  Rounds were done with the Respiratory Therapy Director and Staff therapists and discussed with nursing staff also.  Time 35 minutes  Allyne Gee, MD [facility] Pulmonary Critical Care Medicine Sleep Medicine

## 2019-06-21 ENCOUNTER — Other Ambulatory Visit (HOSPITAL_BASED_OUTPATIENT_CLINIC_OR_DEPARTMENT_OTHER): Payer: Medicare Other

## 2019-06-21 ENCOUNTER — Institutional Professional Consult (permissible substitution) (HOSPITAL_COMMUNITY): Payer: Medicare Other

## 2019-06-21 DIAGNOSIS — I482 Chronic atrial fibrillation, unspecified: Secondary | ICD-10-CM | POA: Diagnosis not present

## 2019-06-21 DIAGNOSIS — G4733 Obstructive sleep apnea (adult) (pediatric): Secondary | ICD-10-CM

## 2019-06-21 DIAGNOSIS — I469 Cardiac arrest, cause unspecified: Secondary | ICD-10-CM

## 2019-06-21 DIAGNOSIS — N170 Acute kidney failure with tubular necrosis: Secondary | ICD-10-CM

## 2019-06-21 DIAGNOSIS — J9621 Acute and chronic respiratory failure with hypoxia: Secondary | ICD-10-CM | POA: Diagnosis not present

## 2019-06-21 DIAGNOSIS — J189 Pneumonia, unspecified organism: Secondary | ICD-10-CM | POA: Diagnosis not present

## 2019-06-21 LAB — CBC
HCT: 25.9 % — ABNORMAL LOW (ref 36.0–46.0)
Hemoglobin: 8.4 g/dL — ABNORMAL LOW (ref 12.0–15.0)
MCH: 30.5 pg (ref 26.0–34.0)
MCHC: 32.4 g/dL (ref 30.0–36.0)
MCV: 94.2 fL (ref 80.0–100.0)
Platelets: 531 10*3/uL — ABNORMAL HIGH (ref 150–400)
RBC: 2.75 MIL/uL — ABNORMAL LOW (ref 3.87–5.11)
RDW: 17.2 % — ABNORMAL HIGH (ref 11.5–15.5)
WBC: 18 10*3/uL — ABNORMAL HIGH (ref 4.0–10.5)
nRBC: 0.2 % (ref 0.0–0.2)

## 2019-06-21 LAB — TYPE AND SCREEN
ABO/RH(D): A POS
Antibody Screen: NEGATIVE
Unit division: 0

## 2019-06-21 LAB — BPAM RBC
Blood Product Expiration Date: 202101282359
ISSUE DATE / TIME: 202101051343
Unit Type and Rh: 6200

## 2019-06-21 LAB — VANCOMYCIN, TROUGH: Vancomycin Tr: 25 ug/mL (ref 15–20)

## 2019-06-21 NOTE — Progress Notes (Signed)
Pulmonary Critical Care Medicine [place]   PULMONARY CRITICAL CARE SERVICE  PROGRESS NOTE  Date of Service: 06/21/2019  Cristina Norris  WEX:[id number]  DOB: March 30, 1966   DOA: 06/01/2019  Referring Physician: Merton Border, MD  HPI: Cristina Norris is a 54 y.o. female seen for follow up of Acute on Chronic Respiratory Failure.  The patient is back on the ventilator apparently FiO2 requirements went up patient is now up to 80% FiO2 overnight yesterday had actually completed 8 hours of weaning now has been failing spontaneous breathing trial.  She has not had a chest x-ray today so I took the liberty of going ahead and ordering a chest film and an echocardiogram also will be done  Medications: Reviewed on Rounds  Physical Exam:  Vitals: Temperature 96.3 pulse 82 respiratory rate 20 blood pressure is 136/75 saturations 98%  Ventilator Settings mode ventilation assist control FiO2 80% tidal volume is 476 PEEP 7  . General: Comfortable at this time . Eyes: Grossly normal lids, irises & conjunctiva . ENT: grossly tongue is normal . Neck: no obvious mass . Cardiovascular: S1 S2 normal no gallop . Respiratory: Coarse breath sounds with a few scattered rhonchi . Abdomen: soft . Skin: no rash seen on limited exam . Musculoskeletal: not rigid . Psychiatric:unable to assess . Neurologic: no seizure no involuntary movements         Lab Data:   Basic Metabolic Panel: Recent Labs  Lab 06/15/19 0529 06/18/19 1221 06/20/19 0612  NA 146* 140 141  K 3.4* 3.6 4.0  CL 111 105 105  CO2 27 24 26   GLUCOSE 103* 206* 104*  BUN 93* 86* 88*  CREATININE 0.90 0.80 0.77  CALCIUM 8.9 9.3 8.7*  MG 1.6* 1.8 1.6*    ABG: Recent Labs  Lab 06/18/19 1233  PHART 7.347*  PCO2ART 51.1*  PO2ART 58.7*  HCO3 27.3  O2SAT 86.1    Liver Function Tests: No results for input(s): AST, ALT, ALKPHOS, BILITOT, PROT, ALBUMIN in the last 168 hours. No results for input(s): LIPASE,  AMYLASE in the last 168 hours. No results for input(s): AMMONIA in the last 168 hours.  CBC: Recent Labs  Lab 06/18/19 1221 06/20/19 0612 06/20/19 1945  WBC 22.9* 15.4* 18.0*  HGB 8.1* 6.5* 8.4*  HCT 25.7* 20.9* 25.9*  MCV 96.6 97.7 94.2  PLT 685* 517* 531*    Cardiac Enzymes: No results for input(s): CKTOTAL, CKMB, CKMBINDEX, TROPONINI in the last 168 hours.  BNP (last 3 results) Recent Labs    07/06/18 0628  BNP 1,109.4*    ProBNP (last 3 results) No results for input(s): PROBNP in the last 8760 hours.  Radiological Exams: No results found.  Assessment/Plan Active Problems:   Acute on chronic respiratory failure with hypoxia (HCC)   Acute tubular necrosis (HCC)   Chronic atrial fibrillation with rapid ventricular response (HCC)   Severe sepsis (HCC)   Healthcare-associated pneumonia   1. Acute on chronic respiratory failure with hypoxia plan is to continue with full vent support for now because of the change in status and higher FiO2 requirements of 80%.  I have gone ahead and ordered a chest x-ray to make sure that there is no worsening of pulmonary edema or pneumonia.  In addition ordered an echocardiogram to reassess the LV function.  Patient did have an echocardiogram done last year in January which had shown decreased systolic function of 35 to 40%. 2. Congestive heart failure last ejection fraction 1 year ago  was 35 to 40% last chest x-ray showing evidence of probable worsening of pulmonary edema would recommend diuresis as tolerated.  Hold off on weaning for now 3. ATN her creatinine is 0.77 BUN is elevated need to monitor this closely 4. Chronic atrial fibrillation rate right now is controlled 5. Severe sepsis hemodynamics are stable we will continue with present management 6. Healthcare associated pneumonia has been treated patient will be getting a follow-up chest x-ray to see if there is any worsening of infiltrates that were noted on the previous  film.   I have personally seen and evaluated the patient, evaluated laboratory and imaging results, formulated the assessment and plan and placed orders. The Patient requires high complexity decision making with multiple systems involvement.  Rounds were done with the Respiratory Therapy Director and Staff therapists and discussed with nursing staff also.  Time 35 minutes patient has had an acute change in status with worsening oxygenation and possible acute pulmonary edema versus pneumonia  Yevonne Pax, MD [facility] Pulmonary Critical Care Medicine Sleep Medicine

## 2019-06-21 NOTE — Progress Notes (Signed)
*  PRELIMINARY RESULTS* Echocardiogram 2D Echocardiogram has been performed.  Jeryl Columbia 06/21/2019, 10:04 AM

## 2019-06-22 ENCOUNTER — Encounter: Payer: Self-pay | Admitting: Internal Medicine

## 2019-06-22 DIAGNOSIS — N170 Acute kidney failure with tubular necrosis: Secondary | ICD-10-CM | POA: Diagnosis not present

## 2019-06-22 DIAGNOSIS — J9621 Acute and chronic respiratory failure with hypoxia: Secondary | ICD-10-CM | POA: Diagnosis not present

## 2019-06-22 DIAGNOSIS — I482 Chronic atrial fibrillation, unspecified: Secondary | ICD-10-CM | POA: Diagnosis not present

## 2019-06-22 DIAGNOSIS — J189 Pneumonia, unspecified organism: Secondary | ICD-10-CM | POA: Diagnosis not present

## 2019-06-22 DIAGNOSIS — I5022 Chronic systolic (congestive) heart failure: Secondary | ICD-10-CM | POA: Diagnosis present

## 2019-06-22 LAB — VANCOMYCIN, TROUGH: Vancomycin Tr: 23 ug/mL (ref 15–20)

## 2019-06-22 NOTE — Progress Notes (Signed)
Pulmonary Critical Care Medicine [place]   PULMONARY CRITICAL CARE SERVICE  PROGRESS NOTE  Date of Service: 06/22/2019  ARIELA MOCHIZUKI  KYH:[id number]  DOB: 1965/09/06   DOA: 06/01/2019  Referring Physician: Merton Border, MD  HPI: BEATRIX BREECE is a 54 y.o. female seen for follow up of Acute on Chronic Respiratory Failure.  Patient had been weaned down as far as FiO2 is concerned yesterday however overnight began had increased work of breathing shortness of breath.  Chest x-ray was done yesterday which shows increased infiltrate I think there is also a pleural effusion noted at the right side.  The patient did also have an echocardiogram done which shows ejection fraction 35 to 40% so patient also does have significant systolic heart failure as well as diastolic heart failure  Medications: Reviewed on Rounds  Physical Exam:  Vitals: Temperature is 96.4 pulse 74 respiratory 29 blood pressure is 143/87 saturations are 92%  Ventilator Settings mode ventilation assist control FiO2 75% tidal volume is 581 PEEP is 7 and may need to be increased  . General: Comfortable at this time . Eyes: Grossly normal lids, irises & conjunctiva . ENT: grossly tongue is normal . Neck: no obvious mass . Cardiovascular: S1 S2 normal no gallop . Respiratory: Coarse breath sounds are noted diminished on the right base . Abdomen: soft . Skin: no rash seen on limited exam . Musculoskeletal: not rigid . Psychiatric:unable to assess . Neurologic: no seizure no involuntary movements         Lab Data:   Basic Metabolic Panel: Recent Labs  Lab 06/18/19 1221 06/20/19 0612  NA 140 141  K 3.6 4.0  CL 105 105  CO2 24 26  GLUCOSE 206* 104*  BUN 86* 88*  CREATININE 0.80 0.77  CALCIUM 9.3 8.7*  MG 1.8 1.6*    ABG: Recent Labs  Lab 06/18/19 1233  PHART 7.347*  PCO2ART 51.1*  PO2ART 58.7*  HCO3 27.3  O2SAT 86.1    Liver Function Tests: No results for input(s): AST,  ALT, ALKPHOS, BILITOT, PROT, ALBUMIN in the last 168 hours. No results for input(s): LIPASE, AMYLASE in the last 168 hours. No results for input(s): AMMONIA in the last 168 hours.  CBC: Recent Labs  Lab 06/18/19 1221 06/20/19 0612 06/20/19 1945  WBC 22.9* 15.4* 18.0*  HGB 8.1* 6.5* 8.4*  HCT 25.7* 20.9* 25.9*  MCV 96.6 97.7 94.2  PLT 685* 517* 531*    Cardiac Enzymes: No results for input(s): CKTOTAL, CKMB, CKMBINDEX, TROPONINI in the last 168 hours.  BNP (last 3 results) Recent Labs    07/06/18 0628  BNP 1,109.4*    ProBNP (last 3 results) No results for input(s): PROBNP in the last 8760 hours.  Radiological Exams: DG CHEST PORT 1 VIEW  Result Date: 06/21/2019 CLINICAL DATA:  CHF EXAM: PORTABLE CHEST 1 VIEW COMPARISON:  06/18/2019 FINDINGS: Support devices are stable. Patchy bilateral airspace disease again noted, worsening in the upper lobes bilaterally. Overall configuration is concerning for pneumonia. Small to moderate right pleural effusion. Possible layering left effusion. Heart is borderline in size. No acute bony abnormality. IMPRESSION: Patchy bilateral airspace disease, worsening in the upper lobes since prior study. Appearance is concerning for multifocal pneumonia. Moderate right and possible small layering left effusion. Electronically Signed   By: Rolm Baptise M.D.   On: 06/21/2019 09:29   ECHOCARDIOGRAM COMPLETE  Result Date: 06/21/2019   ECHOCARDIOGRAM REPORT   Patient Name:   CASSIA FEIN Date of  Exam: 06/21/2019 Medical Rec #:  [id number]        Height: Accession #:    [id number]       Weight: Date of Birth:  07-06-1965        BSA: Patient Age:    53 years         BP:           136/75 mmHg Patient Gender: F                HR:           96 bpm. Exam Location:  Inpatient Procedure: 2D Echo Indications:    CHF-Acute Diastolic 428.31 / I50.31  History:        Patient has prior history of Echocardiogram examinations, most                 recent 07/07/2018.  Arrythmias:Atrial Fibrillation. Cardiac                 Arrest, Acute Tubular Necrosis, Pneumonia, Respiratory failure,                 Severe Sepsis.  Sonographer:    Jeryl Columbia Referring Phys: [phone] Coletta Lockner A Vardaan Depascale IMPRESSIONS  1. Left ventricular ejection fraction, by visual estimation, is 35 to 40%. The left ventricle has moderately decreased function. There is moderately increased left ventricular hypertrophy.  2. Abnormal septal motion consistent with left bundle branch block.  3. Left ventricular diastolic parameters are consistent with Grade II diastolic dysfunction (pseudonormalization).  4. Elevated left atrial pressure.  5. Global right ventricle has normal systolic function.The right ventricular size is normal.  6. Left atrial size was mildly dilated.  7. Right atrial size was normal.  8. The mitral valve is normal in structure. Trivial mitral valve regurgitation.  9. The tricuspid valve is normal in structure. 10. The aortic valve is tricuspid. Aortic valve regurgitation is not visualized. 11. The pulmonic valve was not well visualized. Pulmonic valve regurgitation is not visualized. 12. The inferior vena cava is normal in size with greater than 50% respiratory variability, suggesting right atrial pressure of 3 mmHg. 13. TR signal is inadequate for assessing pulmonary artery systolic pressure. FINDINGS  Left Ventricle: Left ventricular ejection fraction, by visual estimation, is 35 to 40%. The left ventricle has moderately decreased function. The left ventricle demonstrates regional wall motion abnormalities. There is moderately increased left ventricular hypertrophy. Asymmetric left ventricular hypertrophy. Abnormal (paradoxical) septal motion, consistent with left bundle branch block. Left ventricular diastolic parameters are consistent with Grade II diastolic dysfunction (pseudonormalization). Elevated left atrial pressure. Right Ventricle: The right ventricular size is normal. No increase in  right ventricular wall thickness. Global RV systolic function is has normal systolic function. Left Atrium: Left atrial size was mildly dilated. Right Atrium: Right atrial size was normal in size Pericardium: Trivial pericardial effusion is present. There is a small pleural effusion in the left lateral region. Mitral Valve: The mitral valve is normal in structure. Trivial mitral valve regurgitation. Tricuspid Valve: The tricuspid valve is normal in structure. Tricuspid valve regurgitation is not demonstrated. Aortic Valve: The aortic valve is tricuspid. Aortic valve regurgitation is not visualized. Pulmonic Valve: The pulmonic valve was not well visualized. Pulmonic valve regurgitation is not visualized. Pulmonic regurgitation is not visualized. Aorta: The aortic root is normal in size and structure. Venous: The inferior vena cava is normal in size with greater than 50% respiratory variability, suggesting right atrial pressure of 3 mmHg. IAS/Shunts: The interatrial  septum was not well visualized.  LEFT VENTRICLE PLAX 2D LVIDd:         5.10 cm  Diastology LVIDs:         3.90 cm  LV e' lateral:   5.87 cm/s LV PW:         1.60 cm  LV E/e' lateral: 18.9 LV IVS:        1.20 cm  LV e' medial:    5.33 cm/s LVOT diam:     1.90 cm  LV E/e' medial:  20.8 LV SV:         58 ml LVOT Area:     2.84 cm  RIGHT VENTRICLE RV S prime:     14.80 cm/s TAPSE (M-mode): 2.5 cm LEFT ATRIUM              RIGHT ATRIUM LA diam:        4.10 cm  RA Area:     13.50 cm LA Vol (A2C):   101.0 ml RA Volume:   34.00 ml LA Vol (A4C):   98.6 ml LA Biplane Vol: 105.0 ml   AORTA Ao Root diam: 2.60 cm MITRAL VALVE MV Area (PHT): 5.02 cm              SHUNTS MV PHT:        43.79 msec            Systemic Diam: 1.90 cm MV Decel Time: 151 msec MV E velocity: 111.00 cm/s 103 cm/s MV A velocity: 90.40 cm/s  70.3 cm/s MV E/A ratio:  1.23        1.5  Epifanio Lesches MD Electronically signed by Epifanio Lesches MD Signature Date/Time: 06/21/2019/2:41:25 PM     Final     Assessment/Plan Active Problems:   Acute on chronic respiratory failure with hypoxia (HCC)   Acute tubular necrosis (HCC)   Chronic atrial fibrillation with rapid ventricular response (HCC)   Severe sepsis (HCC)   Healthcare-associated pneumonia   Chronic systolic CHF (congestive heart failure), NYHA class 3 (HCC)   1. Acute on chronic respiratory failure hypoxia patient right now is on full support on assist control mode has been requiring high FiO2 levels yesterday patient had been increased to 80% and was decreased however overnight had to go back up on the FiO2.  Chest x-ray showing possibility of pneumonia and also could be consistent with heart failure.  Echocardiogram results as already noted above 2. Chronic atrial fibrillation right now the rate is controlled we'll continue with supportive care. 3. Severe sepsis the hemodynamics are stable but patient is still significantly hypoxic 4. Healthcare associated pneumonia we'll discuss with the primary care team regarding antibiotic choice 5. ATN BUN is 88 creatinine 0.77 need to monitor her fluid status closely. 6. Chronic systolic heart failure last ejection fraction just done on this current echo about 35 to 40% with some wall motion abnormalities noted also.  Consider cardiology evaluation   I have personally seen and evaluated the patient, evaluated laboratory and imaging results, formulated the assessment and plan and placed orders. The Patient requires high complexity decision making with multiple systems involvement.  Time 35 minutes Rounds were done with the Respiratory Therapy Director and Staff therapists and discussed with nursing staff also.  Yevonne Pax, MD [facility] Pulmonary Critical Care Medicine Sleep Medicine

## 2019-06-23 ENCOUNTER — Other Ambulatory Visit (HOSPITAL_COMMUNITY): Payer: Medicare Other

## 2019-06-23 DIAGNOSIS — N170 Acute kidney failure with tubular necrosis: Secondary | ICD-10-CM | POA: Diagnosis not present

## 2019-06-23 DIAGNOSIS — J9621 Acute and chronic respiratory failure with hypoxia: Secondary | ICD-10-CM | POA: Diagnosis not present

## 2019-06-23 DIAGNOSIS — A419 Sepsis, unspecified organism: Secondary | ICD-10-CM | POA: Diagnosis not present

## 2019-06-23 DIAGNOSIS — J189 Pneumonia, unspecified organism: Secondary | ICD-10-CM | POA: Diagnosis not present

## 2019-06-23 LAB — CBC
HCT: 24.1 % — ABNORMAL LOW (ref 36.0–46.0)
Hemoglobin: 7.5 g/dL — ABNORMAL LOW (ref 12.0–15.0)
MCH: 30 pg (ref 26.0–34.0)
MCHC: 31.1 g/dL (ref 30.0–36.0)
MCV: 96.4 fL (ref 80.0–100.0)
Platelets: 461 10*3/uL — ABNORMAL HIGH (ref 150–400)
RBC: 2.5 MIL/uL — ABNORMAL LOW (ref 3.87–5.11)
RDW: 17.2 % — ABNORMAL HIGH (ref 11.5–15.5)
WBC: 10.7 10*3/uL — ABNORMAL HIGH (ref 4.0–10.5)
nRBC: 0.3 % — ABNORMAL HIGH (ref 0.0–0.2)

## 2019-06-23 LAB — VANCOMYCIN, TROUGH: Vancomycin Tr: 19 ug/mL (ref 15–20)

## 2019-06-23 LAB — BASIC METABOLIC PANEL
Anion gap: 12 (ref 5–15)
BUN: 99 mg/dL — ABNORMAL HIGH (ref 6–20)
CO2: 27 mmol/L (ref 22–32)
Calcium: 8.7 mg/dL — ABNORMAL LOW (ref 8.9–10.3)
Chloride: 104 mmol/L (ref 98–111)
Creatinine, Ser: 0.83 mg/dL (ref 0.44–1.00)
GFR calc Af Amer: >60 mL/min (ref >60)
GFR calc non Af Amer: >60 mL/min (ref >60)
Glucose, Bld: 91 mg/dL (ref 70–99)
Potassium: 3.5 mmol/L (ref 3.5–5.1)
Sodium: 143 mmol/L (ref 135–145)

## 2019-06-23 MED ORDER — GENERIC EXTERNAL MEDICATION
Status: DC
Start: ? — End: 2019-06-23

## 2019-06-23 NOTE — Progress Notes (Signed)
Pulmonary Critical Care Medicine [facility]   PULMONARY CRITICAL CARE SERVICE  PROGRESS NOTE  Date of Service: 06/23/2019  Cristina Norris  GLO:[id number]  DOB: 1965-10-24   DOA: 06/01/2019  Referring Physician: Carron Curie, MD  HPI: Cristina Norris is a 54 y.o. female seen for follow up of Acute on Chronic Respiratory Failure.  Patient is on full support currently on assist control mode on 40% FiO2 with a PEEP of 10 currently tidal volume is 420  Medications: Reviewed on Rounds  Physical Exam:  Vitals: Temperature 95.9 pulse 78 respiratory rate 17 blood pressure 169/89 saturations 100%  Ventilator Settings mode of ventilation assist control FiO2 is 40% tidal volume 420 PEEP 10  . General: Comfortable at this time . Eyes: Grossly normal lids, irises & conjunctiva . ENT: grossly tongue is normal . Neck: no obvious mass . Cardiovascular: S1 S2 normal no gallop . Respiratory: Coarse breath sounds with few scattered rhonchi . Abdomen: soft . Skin: no rash seen on limited exam . Musculoskeletal: not rigid . Psychiatric:unable to assess . Neurologic: no seizure no involuntary movements         Lab Data:   Basic Metabolic Panel: Recent Labs  Lab 06/18/19 1221 06/20/19 0612 06/23/19 0548  NA 140 141 143  K 3.6 4.0 3.5  CL 105 105 104  CO2 24 26 27   GLUCOSE 206* 104* 91  BUN 86* 88* 99*  CREATININE 0.80 0.77 0.83  CALCIUM 9.3 8.7* 8.7*  MG 1.8 1.6*  --     ABG: Recent Labs  Lab 06/18/19 1233  PHART 7.347*  PCO2ART 51.1*  PO2ART 58.7*  HCO3 27.3  O2SAT 86.1    Liver Function Tests: No results for input(s): AST, ALT, ALKPHOS, BILITOT, PROT, ALBUMIN in the last 168 hours. No results for input(s): LIPASE, AMYLASE in the last 168 hours. No results for input(s): AMMONIA in the last 168 hours.  CBC: Recent Labs  Lab 06/18/19 1221 06/20/19 0612 06/20/19 1945 06/23/19 0548  WBC 22.9* 15.4* 18.0* 10.7*  HGB 8.1* 6.5* 8.4* 7.5*  HCT  25.7* 20.9* 25.9* 24.1*  MCV 96.6 97.7 94.2 96.4  PLT 685* 517* 531* 461*    Cardiac Enzymes: No results for input(s): CKTOTAL, CKMB, CKMBINDEX, TROPONINI in the last 168 hours.  BNP (last 3 results) Recent Labs    07/06/18 0628  BNP 1,109.4*    ProBNP (last 3 results) No results for input(s): PROBNP in the last 8760 hours.  Radiological Exams: No results found.  Assessment/Plan Active Problems:   Acute on chronic respiratory failure with hypoxia (HCC)   Acute tubular necrosis (HCC)   Chronic atrial fibrillation with rapid ventricular response (HCC)   Severe sepsis (HCC)   Healthcare-associated pneumonia   Chronic systolic CHF (congestive heart failure), NYHA class 3 (HCC)   1. Acute on chronic respiratory failure hypoxia plan is to continue with full support on the ventilator right now mechanics have been poor and not able to tolerate weaning. 2. Chronic atrial fibrillation rate is controlled 3. Severe sepsis hemodynamics are stable 4. Healthcare associated pneumonia treated we will continue to follow 5. Chronic systolic heart failure at baseline continue with supportive care   I have personally seen and evaluated the patient, evaluated laboratory and imaging results, formulated the assessment and plan and placed orders. The Patient requires high complexity decision making with multiple systems involvement.  Rounds were done with the Respiratory Therapy Director and Staff therapists and discussed with nursing staff also.  Allyne Gee, MD [facility] Pulmonary Critical Care Medicine Sleep Medicine

## 2019-06-24 ENCOUNTER — Other Ambulatory Visit (HOSPITAL_COMMUNITY): Payer: Medicare Other

## 2019-06-24 DIAGNOSIS — N170 Acute kidney failure with tubular necrosis: Secondary | ICD-10-CM | POA: Diagnosis not present

## 2019-06-24 DIAGNOSIS — J189 Pneumonia, unspecified organism: Secondary | ICD-10-CM | POA: Diagnosis not present

## 2019-06-24 DIAGNOSIS — A419 Sepsis, unspecified organism: Secondary | ICD-10-CM | POA: Diagnosis not present

## 2019-06-24 DIAGNOSIS — J9621 Acute and chronic respiratory failure with hypoxia: Secondary | ICD-10-CM | POA: Diagnosis not present

## 2019-06-24 NOTE — Progress Notes (Addendum)
Pulmonary Critical Care Medicine [place]   PULMONARY CRITICAL CARE SERVICE  PROGRESS NOTE  Date of Service: 06/24/2019  DANAMARIE MINAMI  NFA:[id number]  DOB: Feb 08, 1966   DOA: 06/01/2019  Referring Physician: Merton Border, MD  HPI: Cristina Norris is a 54 y.o. female seen for follow up of Acute on Chronic Respiratory Failure.  Patient remains on full support on the ventilator at this time satting well with no distress.  Medications: Reviewed on Rounds  Physical Exam:  Vitals: Pulse 76 respirations 18 BP 140/44 O2 sat 100% temp 97.2  Ventilator Settings ventilator mode AC PC rate of 10 tidal volume 420 PEEP of 10 with an FiO2 of 35%  . General: Comfortable at this time . Eyes: Grossly normal lids, irises & conjunctiva . ENT: grossly tongue is normal . Neck: no obvious mass . Cardiovascular: S1 S2 normal no gallop . Respiratory: Coarse breath sounds . Abdomen: soft . Skin: no rash seen on limited exam . Musculoskeletal: not rigid . Psychiatric:unable to assess . Neurologic: no seizure no involuntary movements         Lab Data:   Basic Metabolic Panel: Recent Labs  Lab 06/18/19 1221 06/20/19 0612 06/23/19 0548  NA 140 141 143  K 3.6 4.0 3.5  CL 105 105 104  CO2 24 26 27   GLUCOSE 206* 104* 91  BUN 86* 88* 99*  CREATININE 0.80 0.77 0.83  CALCIUM 9.3 8.7* 8.7*  MG 1.8 1.6*  --     ABG: Recent Labs  Lab 06/18/19 1233  PHART 7.347*  PCO2ART 51.1*  PO2ART 58.7*  HCO3 27.3  O2SAT 86.1    Liver Function Tests: No results for input(s): AST, ALT, ALKPHOS, BILITOT, PROT, ALBUMIN in the last 168 hours. No results for input(s): LIPASE, AMYLASE in the last 168 hours. No results for input(s): AMMONIA in the last 168 hours.  CBC: Recent Labs  Lab 06/18/19 1221 06/20/19 0612 06/20/19 1945 06/23/19 0548  WBC 22.9* 15.4* 18.0* 10.7*  HGB 8.1* 6.5* 8.4* 7.5*  HCT 25.7* 20.9* 25.9* 24.1*  MCV 96.6 97.7 94.2 96.4  PLT 685* 517* 531* 461*     Cardiac Enzymes: No results for input(s): CKTOTAL, CKMB, CKMBINDEX, TROPONINI in the last 168 hours.  BNP (last 3 results) Recent Labs    07/06/18 0628  BNP 1,109.4*    ProBNP (last 3 results) No results for input(s): PROBNP in the last 8760 hours.  Radiological Exams: CT ABDOMEN WO CONTRAST  Result Date: 06/23/2019 CLINICAL DATA:  Respiratory failure and evaluation of anatomy for possible gastrostomy tube placement. EXAM: CT ABDOMEN WITHOUT CONTRAST TECHNIQUE: Multidetector CT imaging of the abdomen was performed following the standard protocol without IV contrast. COMPARISON:  None. FINDINGS: Lower chest: Atelectasis/consolidation of the posterior right lower lobe with associated trace right pleural fluid. Hepatobiliary: No focal liver abnormality is seen. No gallstones, gallbladder wall thickening, or biliary dilatation. Pancreas: Unremarkable. No pancreatic ductal dilatation or surrounding inflammatory changes. Spleen: Normal in size without focal abnormality. Adrenals/Urinary Tract: Adrenal glands are unremarkable. Kidneys are normal, without renal calculi, focal lesion, or hydronephrosis. Stomach/Bowel: Gastric decompression tube extends into the distal stomach. The stomach is vertically oriented to the left of midline. No hiatal hernia. No colon or other bowel overlies the stomach anteriorly. No evidence of bowel obstruction, ileus or free air. No abnormal fluid collections. Vascular/Lymphatic: No significant vascular findings are present. No enlarged abdominal lymph nodes. Other: No abdominal wall hernia or ascites. Musculoskeletal: No acute or significant osseous  findings. IMPRESSION: 1. Atelectasis/consolidation of the posterior right lower lobe with associated trace right pleural fluid. 2. The stomach is vertically oriented to the left of midline. No anatomic contraindication to attempted percutaneous gastrostomy tube placement. Electronically Signed   By: Irish Lack M.D.   On:  06/23/2019 16:35   DG CHEST PORT 1 VIEW  Result Date: 06/24/2019 CLINICAL DATA:  Respiratory failure, tracheostomy EXAM: PORTABLE CHEST 1 VIEW COMPARISON:  06/21/2019 FINDINGS: Stable cardiomegaly. Significant improvement in bilateral upper lobe and right lower lobe patchy airspace opacities. There remains residual left lower lobe collapse/consolidation obscuring the left hemidiaphragm. No enlarging effusion or pneumothorax. Tracheostomy and NG tube are stable in position. IMPRESSION: Improving bilateral airspace process compatible with resolving alveolar edema/pneumonia. Residual left lower lobe collapse/consolidation Electronically Signed   By: Judie Petit.  Shick M.D.   On: 06/24/2019 08:59    Assessment/Plan Active Problems:   Acute on chronic respiratory failure with hypoxia (HCC)   Acute tubular necrosis (HCC)   Chronic atrial fibrillation with rapid ventricular response (HCC)   Severe sepsis (HCC)   Healthcare-associated pneumonia   Chronic systolic CHF (congestive heart failure), NYHA class 3 (HCC)   1. Acute on chronic respiratory failure hypoxia continue full support on the ventilator mechanics remain poor.  Continue supportive measures and pulmonary toilet. 2. Chronic atrial fibrillation rate is controlled 3. Severe sepsis hemodynamics are stable 4. Healthcare associated pneumonia treated we will continue to follow 5. Chronic systolic heart failure at baseline continue with supportive care   I have personally seen and evaluated the patient, evaluated laboratory and imaging results, formulated the assessment and plan and placed orders. The Patient requires high complexity decision making with multiple systems involvement.  Rounds were done with the Respiratory Therapy Director and Staff therapists and discussed with nursing staff also.  Yevonne Pax, MD [facility] Pulmonary Critical Care Medicine Sleep Medicine

## 2019-06-25 DIAGNOSIS — N170 Acute kidney failure with tubular necrosis: Secondary | ICD-10-CM | POA: Diagnosis not present

## 2019-06-25 DIAGNOSIS — J9621 Acute and chronic respiratory failure with hypoxia: Secondary | ICD-10-CM | POA: Diagnosis not present

## 2019-06-25 DIAGNOSIS — J189 Pneumonia, unspecified organism: Secondary | ICD-10-CM | POA: Diagnosis not present

## 2019-06-25 DIAGNOSIS — I482 Chronic atrial fibrillation, unspecified: Secondary | ICD-10-CM | POA: Diagnosis not present

## 2019-06-25 LAB — CBC WITH DIFFERENTIAL/PLATELET
Abs Immature Granulocytes: 0.28 10*3/uL — ABNORMAL HIGH (ref 0.00–0.07)
Basophils Absolute: 0.1 10*3/uL (ref 0.0–0.1)
Basophils Relative: 1 %
Eosinophils Absolute: 0.6 10*3/uL — ABNORMAL HIGH (ref 0.0–0.5)
Eosinophils Relative: 6 %
HCT: 22.8 % — ABNORMAL LOW (ref 36.0–46.0)
Hemoglobin: 7 g/dL — ABNORMAL LOW (ref 12.0–15.0)
Immature Granulocytes: 3 %
Lymphocytes Relative: 21 %
Lymphs Abs: 2.2 10*3/uL (ref 0.7–4.0)
MCH: 29.7 pg (ref 26.0–34.0)
MCHC: 30.7 g/dL (ref 30.0–36.0)
MCV: 96.6 fL (ref 80.0–100.0)
Monocytes Absolute: 1.3 10*3/uL — ABNORMAL HIGH (ref 0.1–1.0)
Monocytes Relative: 13 %
Neutro Abs: 5.8 10*3/uL (ref 1.7–7.7)
Neutrophils Relative %: 56 %
Platelets: 434 10*3/uL — ABNORMAL HIGH (ref 150–400)
RBC: 2.36 MIL/uL — ABNORMAL LOW (ref 3.87–5.11)
RDW: 16.5 % — ABNORMAL HIGH (ref 11.5–15.5)
WBC: 10.2 10*3/uL (ref 4.0–10.5)
nRBC: 0.3 % — ABNORMAL HIGH (ref 0.0–0.2)

## 2019-06-25 MED ORDER — CEFAZOLIN SODIUM-DEXTROSE 2-4 GM/100ML-% IV SOLN: 2.0000 g | INTRAVENOUS | Status: AC

## 2019-06-25 NOTE — Progress Notes (Signed)
Pulmonary Critical Care Medicine [place]   PULMONARY CRITICAL CARE SERVICE  PROGRESS NOTE  Date of Service: 06/25/2019  CHRISMA HURLOCK  GEZ:[id number]  DOB: 1965/10/01   DOA: 06/01/2019  Referring Physician: Merton Border, MD  HPI: MONICA CODD is a 54 y.o. female seen for follow up of Acute on Chronic Respiratory Failure.  Patient currently is on full support on the ventilator gradually decreasing her FiO2 down.  She had been as high as 80% FiO2 now the FiO2 was decreased down to 30%.  PEEP also is being weaned gradually and now down to a PEEP of 9 cm water pressure  Medications: Reviewed on Rounds  Physical Exam:  Vitals: Temperature is 97.4 pulse 90 respiratory 16 blood pressure is 161/89 saturations 99%  Ventilator Settings mode of ventilation assist control FiO2 30% tidal volume is 420 PEEP 9  . General: Comfortable at this time . Eyes: Grossly normal lids, irises & conjunctiva . ENT: grossly tongue is normal . Neck: no obvious mass . Cardiovascular: S1 S2 normal no gallop . Respiratory: No rhonchi coarse breath sounds are noted at this time . Abdomen: soft . Skin: no rash seen on limited exam . Musculoskeletal: not rigid . Psychiatric:unable to assess . Neurologic: no seizure no involuntary movements         Lab Data:   Basic Metabolic Panel: Recent Labs  Lab 06/20/19 0612 06/23/19 0548  NA 141 143  K 4.0 3.5  CL 105 104  CO2 26 27  GLUCOSE 104* 91  BUN 88* 99*  CREATININE 0.77 0.83  CALCIUM 8.7* 8.7*  MG 1.6*  --     ABG: No results for input(s): PHART, PCO2ART, PO2ART, HCO3, O2SAT in the last 168 hours.  Liver Function Tests: No results for input(s): AST, ALT, ALKPHOS, BILITOT, PROT, ALBUMIN in the last 168 hours. No results for input(s): LIPASE, AMYLASE in the last 168 hours. No results for input(s): AMMONIA in the last 168 hours.  CBC: Recent Labs  Lab 06/20/19 0612 06/20/19 1945 06/23/19 0548 06/25/19 1600  WBC  15.4* 18.0* 10.7* 10.2  NEUTROABS  --   --   --  5.8  HGB 6.5* 8.4* 7.5* 7.0*  HCT 20.9* 25.9* 24.1* 22.8*  MCV 97.7 94.2 96.4 96.6  PLT 517* 531* 461* 434*    Cardiac Enzymes: No results for input(s): CKTOTAL, CKMB, CKMBINDEX, TROPONINI in the last 168 hours.  BNP (last 3 results) Recent Labs    07/06/18 0628  BNP 1,109.4*    ProBNP (last 3 results) No results for input(s): PROBNP in the last 8760 hours.  Radiological Exams: DG CHEST PORT 1 VIEW  Result Date: 06/24/2019 CLINICAL DATA:  Respiratory failure, tracheostomy EXAM: PORTABLE CHEST 1 VIEW COMPARISON:  06/21/2019 FINDINGS: Stable cardiomegaly. Significant improvement in bilateral upper lobe and right lower lobe patchy airspace opacities. There remains residual left lower lobe collapse/consolidation obscuring the left hemidiaphragm. No enlarging effusion or pneumothorax. Tracheostomy and NG tube are stable in position. IMPRESSION: Improving bilateral airspace process compatible with resolving alveolar edema/pneumonia. Residual left lower lobe collapse/consolidation Electronically Signed   By: Jerilynn Mages.  Shick M.D.   On: 06/24/2019 08:59    Assessment/Plan Active Problems:   Acute on chronic respiratory failure with hypoxia (HCC)   Acute tubular necrosis (HCC)   Chronic atrial fibrillation with rapid ventricular response (HCC)   Severe sepsis (HCC)   Healthcare-associated pneumonia   Chronic systolic CHF (congestive heart failure), NYHA class 3 ([place])   1. Acute on  chronic respiratory failure with hypoxia the plan is to continue with weaning FiO2 as tolerated.  The last chest x-ray was showing improvement of bilateral airspace disease which is of course leading to an improvement in her oxygenation also.  Once back to baseline hopefully we can advance with the weaning again 2. Chronic atrial fibrillation rate controlled at this time we will continue to monitor rate 3. Chronic systolic heart failure appears to be improving diuretics  as necessary 4. Severe sepsis hemodynamics are stable 5. Healthcare associated pneumonia treated clinically is improved we will continue to follow along   I have personally seen and evaluated the patient, evaluated laboratory and imaging results, formulated the assessment and plan and placed orders. The Patient requires high complexity decision making with multiple systems involvement.  Time 35 minutes Rounds were done with the Respiratory Therapy Director and Staff therapists and discussed with nursing staff also.  Yevonne Pax, MD [facility] Pulmonary Critical Care Medicine Sleep Medicine

## 2019-06-26 DIAGNOSIS — J189 Pneumonia, unspecified organism: Secondary | ICD-10-CM | POA: Diagnosis not present

## 2019-06-26 DIAGNOSIS — A419 Sepsis, unspecified organism: Secondary | ICD-10-CM | POA: Diagnosis not present

## 2019-06-26 DIAGNOSIS — N170 Acute kidney failure with tubular necrosis: Secondary | ICD-10-CM | POA: Diagnosis not present

## 2019-06-26 DIAGNOSIS — J9621 Acute and chronic respiratory failure with hypoxia: Secondary | ICD-10-CM | POA: Diagnosis not present

## 2019-06-26 LAB — BASIC METABOLIC PANEL
Anion gap: 11 (ref 5–15)
BUN: 99 mg/dL — ABNORMAL HIGH (ref 6–20)
CO2: 31 mmol/L (ref 22–32)
Calcium: 8.8 mg/dL — ABNORMAL LOW (ref 8.9–10.3)
Chloride: 105 mmol/L (ref 98–111)
Creatinine, Ser: 0.89 mg/dL (ref 0.44–1.00)
GFR calc Af Amer: >60 mL/min (ref >60)
GFR calc non Af Amer: >60 mL/min (ref >60)
Glucose, Bld: 102 mg/dL — ABNORMAL HIGH (ref 70–99)
Potassium: 3 mmol/L — ABNORMAL LOW (ref 3.5–5.1)
Sodium: 147 mmol/L — ABNORMAL HIGH (ref 135–145)

## 2019-06-26 LAB — SARS CORONAVIRUS 2 (TAT 6-24 HRS): SARS Coronavirus 2: NEGATIVE

## 2019-06-26 LAB — PROTIME-INR
INR: 1.3 — ABNORMAL HIGH (ref 0.8–1.2)
Prothrombin Time: 15.8 seconds — ABNORMAL HIGH (ref 11.4–15.2)

## 2019-06-26 NOTE — Progress Notes (Signed)
Pulmonary Critical Care Medicine [facility]   PULMONARY CRITICAL CARE SERVICE  PROGRESS NOTE  Date of Service: 06/26/2019  Cristina Norris  IRW:[id number]  DOB: May 29, 1966   DOA: 06/01/2019  Referring Physician: Carron Curie, MD  HPI: Cristina Norris is a 54 y.o. female seen for follow up of Acute on Chronic Respiratory Failure. Patient right now is on assist control mode on full support right now  Medications: Reviewed on Rounds  Physical Exam:  Vitals: Temperature 98.4 pulse 86 respiratory rate 17 blood pressure is 157/81 saturations 97%  Ventilator Settings mode of ventilation assist control FiO2 28% tidal volume 484 PEEP 7  . General: Comfortable at this time . Eyes: Grossly normal lids, irises & conjunctiva . ENT: grossly tongue is normal . Neck: no obvious mass . Cardiovascular: S1 S2 normal no gallop . Respiratory: Scattered rhonchi expansion there is equal . Abdomen: soft . Skin: no rash seen on limited exam . Musculoskeletal: not rigid . Psychiatric:unable to assess . Neurologic: no seizure no involuntary movements         Lab Data:   Basic Metabolic Panel: Recent Labs  Lab 06/20/19 0612 06/23/19 0548 06/26/19 0534  NA 141 143 147*  K 4.0 3.5 3.0*  CL 105 104 105  CO2 26 27 31   GLUCOSE 104* 91 102*  BUN 88* 99* 99*  CREATININE 0.77 0.83 0.89  CALCIUM 8.7* 8.7* 8.8*  MG 1.6*  --   --     ABG: No results for input(s): PHART, PCO2ART, PO2ART, HCO3, O2SAT in the last 168 hours.  Liver Function Tests: No results for input(s): AST, ALT, ALKPHOS, BILITOT, PROT, ALBUMIN in the last 168 hours. No results for input(s): LIPASE, AMYLASE in the last 168 hours. No results for input(s): AMMONIA in the last 168 hours.  CBC: Recent Labs  Lab 06/20/19 0612 06/20/19 1945 06/23/19 0548 06/25/19 1600  WBC 15.4* 18.0* 10.7* 10.2  NEUTROABS  --   --   --  5.8  HGB 6.5* 8.4* 7.5* 7.0*  HCT 20.9* 25.9* 24.1* 22.8*  MCV 97.7 94.2 96.4 96.6   PLT 517* 531* 461* 434*    Cardiac Enzymes: No results for input(s): CKTOTAL, CKMB, CKMBINDEX, TROPONINI in the last 168 hours.  BNP (last 3 results) Recent Labs    07/06/18 0628  BNP 1,109.4*    ProBNP (last 3 results) No results for input(s): PROBNP in the last 8760 hours.  Radiological Exams: No results found.  Assessment/Plan Active Problems:   Acute on chronic respiratory failure with hypoxia (HCC)   Acute tubular necrosis (HCC)   Chronic atrial fibrillation with rapid ventricular response (HCC)   Severe sepsis (HCC)   Healthcare-associated pneumonia   Chronic systolic CHF (congestive heart failure), NYHA class 3 (HCC)   1. Acute on chronic respiratory failure hypoxia patient is showing some signs of improvement with improved oxygenation.  I will ask respiratory therapy to try to begin the weaning again this week 2. Acute tubular necrosis no change 3. Chronic atrial fibrillation rate controlled 4. Chronic systolic heart failure now appears to be improved with improved oxygenation 5. Healthcare associated pneumonia treated we will continue to follow 6. Severe sepsis hemodynamics are stable   I have personally seen and evaluated the patient, evaluated laboratory and imaging results, formulated the assessment and plan and placed orders. The Patient requires high complexity decision making with multiple systems involvement.  Rounds were done with the Respiratory Therapy Director and Staff therapists and discussed with nursing  staff also.  Allyne Gee, MD [facility] Pulmonary Critical Care Medicine Sleep Medicine

## 2019-06-26 NOTE — Consult Note (Addendum)
Chief Complaint: Patient was seen in consultation today for percutaneous gastric tube placement at the request of Dr Sharyon Medicus   Supervising Physician: Oley Balm  Patient Status: [facility] IP  History of Present Illness: Cristina Norris is a 54 y.o. female   Covid neg CAD; PNA/sepsis COPD CHF Vent dependent upon Transfer to [facility] for management Now trach - no vent  Protein calorie malnutrition Dysphagia FTT Need for long term care  Request for percutaneous gastric tube placement Imaging reviewed and procedure approved    Past Medical History:  Diagnosis Date  . Acute on chronic respiratory failure with hypoxia (HCC)   . Acute tubular necrosis (HCC)   . Cardiac arrest (HCC)   . Chronic atrial fibrillation with rapid ventricular response (HCC)   . Chronic systolic CHF (congestive heart failure), NYHA class 3 (HCC)   . Healthcare-associated pneumonia   . Obstructive sleep apnea   . Severe sepsis (HCC)     Allergies: Patient has no allergy information on record.  Medications: Prior to Admission medications   Not on File     No family history on file.  Social History   Socioeconomic History  . Marital status: Single    Spouse name: Not on file  . Number of children: Not on file  . Years of education: Not on file  . Highest education level: Not on file  Occupational History  . Not on file  Tobacco Use  . Smoking status: Not on file  Substance and Sexual Activity  . Alcohol use: Not on file  . Drug use: Not on file  . Sexual activity: Not on file  Other Topics Concern  . Not on file  Social History Narrative  . Not on file   Social Determinants of Health   Financial Resource Strain:   . Difficulty of Paying Living Expenses: Not on file  Food Insecurity:   . Worried About Programme researcher, broadcasting/film/video in the Last Year: Not on file  . Ran Out of Food in the Last Year: Not on file  Transportation Needs:   . Lack of Transportation (Medical): Not on  file  . Lack of Transportation (Non-Medical): Not on file  Physical Activity:   . Days of Exercise per Week: Not on file  . Minutes of Exercise per Session: Not on file  Stress:   . Feeling of Stress : Not on file  Social Connections:   . Frequency of Communication with Friends and Family: Not on file  . Frequency of Social Gatherings with Friends and Family: Not on file  . Attends Religious Services: Not on file  . Active Member of Clubs or Organizations: Not on file  . Attends Banker Meetings: Not on file  . Marital Status: Not on file    Review of Systems: A 12 point ROS discussed and pertinent positives are indicated in the HPI above.  All other systems are negative.   Vital Signs: There were no vitals taken for this visit.  Physical Exam Vitals reviewed.  Cardiovascular:     Rate and Rhythm: Normal rate and regular rhythm.  Skin:    General: Skin is warm and dry.  Neurological:     Mental Status: She is alert. Mental status is at baseline.  Psychiatric:     Comments: Calling son Fayrene Fearing and [place]-- NA as of yet     Imaging: CT ABDOMEN WO CONTRAST  Result Date: 06/23/2019 CLINICAL DATA:  Respiratory failure and evaluation of anatomy  for possible gastrostomy tube placement. EXAM: CT ABDOMEN WITHOUT CONTRAST TECHNIQUE: Multidetector CT imaging of the abdomen was performed following the standard protocol without IV contrast. COMPARISON:  None. FINDINGS: Lower chest: Atelectasis/consolidation of the posterior right lower lobe with associated trace right pleural fluid. Hepatobiliary: No focal liver abnormality is seen. No gallstones, gallbladder wall thickening, or biliary dilatation. Pancreas: Unremarkable. No pancreatic ductal dilatation or surrounding inflammatory changes. Spleen: Normal in size without focal abnormality. Adrenals/Urinary Tract: Adrenal glands are unremarkable. Kidneys are normal, without renal calculi, focal lesion, or hydronephrosis.  Stomach/Bowel: Gastric decompression tube extends into the distal stomach. The stomach is vertically oriented to the left of midline. No hiatal hernia. No colon or other bowel overlies the stomach anteriorly. No evidence of bowel obstruction, ileus or free air. No abnormal fluid collections. Vascular/Lymphatic: No significant vascular findings are present. No enlarged abdominal lymph nodes. Other: No abdominal wall hernia or ascites. Musculoskeletal: No acute or significant osseous findings. IMPRESSION: 1. Atelectasis/consolidation of the posterior right lower lobe with associated trace right pleural fluid. 2. The stomach is vertically oriented to the left of midline. No anatomic contraindication to attempted percutaneous gastrostomy tube placement. Electronically Signed   By: Irish Lack M.D.   On: 06/23/2019 16:35   DG CHEST PORT 1 VIEW  Result Date: 06/24/2019 CLINICAL DATA:  Respiratory failure, tracheostomy EXAM: PORTABLE CHEST 1 VIEW COMPARISON:  06/21/2019 FINDINGS: Stable cardiomegaly. Significant improvement in bilateral upper lobe and right lower lobe patchy airspace opacities. There remains residual left lower lobe collapse/consolidation obscuring the left hemidiaphragm. No enlarging effusion or pneumothorax. Tracheostomy and NG tube are stable in position. IMPRESSION: Improving bilateral airspace process compatible with resolving alveolar edema/pneumonia. Residual left lower lobe collapse/consolidation Electronically Signed   By: Judie Petit.  Shick M.D.   On: 06/24/2019 08:59   DG CHEST PORT 1 VIEW  Result Date: 06/21/2019 CLINICAL DATA:  CHF EXAM: PORTABLE CHEST 1 VIEW COMPARISON:  06/18/2019 FINDINGS: Support devices are stable. Patchy bilateral airspace disease again noted, worsening in the upper lobes bilaterally. Overall configuration is concerning for pneumonia. Small to moderate right pleural effusion. Possible layering left effusion. Heart is borderline in size. No acute bony abnormality.  IMPRESSION: Patchy bilateral airspace disease, worsening in the upper lobes since prior study. Appearance is concerning for multifocal pneumonia. Moderate right and possible small layering left effusion. Electronically Signed   By: Charlett Nose M.D.   On: 06/21/2019 09:29   DG Chest Port 1 View  Result Date: 06/18/2019 CLINICAL DATA:  Oxygen desaturation EXAM: PORTABLE CHEST 1 VIEW COMPARISON:  06/05/2019 FINDINGS: Tracheostomy tube, gastric catheter and right-sided PICC line are again seen and stable. Cardiac shadow is within normal limits. Patchy infiltrates are noted in the bases bilaterally consistent with acute infiltrate. Small pleural effusions are noted bilaterally. No bony abnormality is seen. IMPRESSION: Increasing bibasilar infiltrates with associated effusions. Tubes and lines as described. Electronically Signed   By: Alcide Clever M.D.   On: 06/18/2019 12:43   DG Chest Port 1 View  Result Date: 06/05/2019 CLINICAL DATA:  54 year old female with acute on chronic respiratory failure. EXAM: PORTABLE CHEST 1 VIEW COMPARISON:  Portable chest 06/02/2019 and earlier. FINDINGS: Portable AP semi upright view at 0725 hours. Stable tracheostomy. Stable visible enteric tube. Stable right PICC line. Stable mediastinal contours. Slightly lower lung volumes. Mild increased streaky opacity at both lung bases. No pneumothorax or pulmonary edema. No pleural effusion. Negative visible bowel gas pattern. Stable visualized osseous structures. IMPRESSION: 1.  Stable lines and tubes. 2.  Mildly lower lung volumes. Favor increased lung base atelectasis rather than infection since 06/02/2019. Electronically Signed   By: Odessa Fleming M.D.   On: 06/05/2019 07:55   DG Chest Port 1 View  Result Date: 06/02/2019 CLINICAL DATA:  Respiratory failure. EXAM: PORTABLE CHEST 1 VIEW COMPARISON:  07/17/2018 FINDINGS: Tracheostomy tube, NG tube, and PICC all appear in good position. The tip of the PICC is in the superior vena cava just  below the carina in good position. Heart size and pulmonary vascularity are normal. Slight increased density at the left base posterior medially may represent atelectasis. Lungs are otherwise clear. No acute bone abnormality. IMPRESSION: 1. Slight increased density at the left base posterior medially may represent atelectasis. Otherwise, no significant abnormalities. 2. Tubes and lines in good position. Electronically Signed   By: Francene Boyers M.D.   On: 06/02/2019 12:43   DG Abd Portable 1V  Result Date: 06/02/2019 CLINICAL DATA:  Nasogastric tube placement. EXAM: PORTABLE ABDOMEN - 1 VIEW COMPARISON:  07/04/2018 FINDINGS: Tip and side port of the enteric tube below the diaphragm in the stomach. No bowel dilatation to suggest obstruction. IMPRESSION: Tip and side port of the enteric tube below the diaphragm in the stomach. Electronically Signed   By: Narda Rutherford M.D.   On: 06/02/2019 01:11   ECHOCARDIOGRAM COMPLETE  Result Date: 06/21/2019   ECHOCARDIOGRAM REPORT   Patient Name:   SEFORA TIETJE Date of Exam: 06/21/2019 Medical Rec #:  [id number]        Height: Accession #:    [id number]       Weight: Date of Birth:  1965/07/08        BSA: Patient Age:    53 years         BP:           136/75 mmHg Patient Gender: F                HR:           96 bpm. Exam Location:  Inpatient Procedure: 2D Echo Indications:    CHF-Acute Diastolic 428.31 / I50.31  History:        Patient has prior history of Echocardiogram examinations, most                 recent 07/07/2018. Arrythmias:Atrial Fibrillation. Cardiac                 Arrest, Acute Tubular Necrosis, Pneumonia, Respiratory failure,                 Severe Sepsis.  Sonographer:    Jeryl Columbia Referring Phys: [id number] SAADAT A KHAN IMPRESSIONS  1. Left ventricular ejection fraction, by visual estimation, is 35 to 40%. The left ventricle has moderately decreased function. There is moderately increased left ventricular hypertrophy.  2. Abnormal septal motion  consistent with left bundle branch block.  3. Left ventricular diastolic parameters are consistent with Grade II diastolic dysfunction (pseudonormalization).  4. Elevated left atrial pressure.  5. Global right ventricle has normal systolic function.The right ventricular size is normal.  6. Left atrial size was mildly dilated.  7. Right atrial size was normal.  8. The mitral valve is normal in structure. Trivial mitral valve regurgitation.  9. The tricuspid valve is normal in structure. 10. The aortic valve is tricuspid. Aortic valve regurgitation is not visualized. 11. The pulmonic valve was not well visualized. Pulmonic valve regurgitation is not visualized. 12. The inferior vena cava is normal in  size with greater than 50% respiratory variability, suggesting right atrial pressure of 3 mmHg. 13. TR signal is inadequate for assessing pulmonary artery systolic pressure. FINDINGS  Left Ventricle: Left ventricular ejection fraction, by visual estimation, is 35 to 40%. The left ventricle has moderately decreased function. The left ventricle demonstrates regional wall motion abnormalities. There is moderately increased left ventricular hypertrophy. Asymmetric left ventricular hypertrophy. Abnormal (paradoxical) septal motion, consistent with left bundle branch block. Left ventricular diastolic parameters are consistent with Grade II diastolic dysfunction (pseudonormalization). Elevated left atrial pressure. Right Ventricle: The right ventricular size is normal. No increase in right ventricular wall thickness. Global RV systolic function is has normal systolic function. Left Atrium: Left atrial size was mildly dilated. Right Atrium: Right atrial size was normal in size Pericardium: Trivial pericardial effusion is present. There is a small pleural effusion in the left lateral region. Mitral Valve: The mitral valve is normal in structure. Trivial mitral valve regurgitation. Tricuspid Valve: The tricuspid valve is normal in  structure. Tricuspid valve regurgitation is not demonstrated. Aortic Valve: The aortic valve is tricuspid. Aortic valve regurgitation is not visualized. Pulmonic Valve: The pulmonic valve was not well visualized. Pulmonic valve regurgitation is not visualized. Pulmonic regurgitation is not visualized. Aorta: The aortic root is normal in size and structure. Venous: The inferior vena cava is normal in size with greater than 50% respiratory variability, suggesting right atrial pressure of 3 mmHg. IAS/Shunts: The interatrial septum was not well visualized.  LEFT VENTRICLE PLAX 2D LVIDd:         5.10 cm  Diastology LVIDs:         3.90 cm  LV e' lateral:   5.87 cm/s LV PW:         1.60 cm  LV E/e' lateral: 18.9 LV IVS:        1.20 cm  LV e' medial:    5.33 cm/s LVOT diam:     1.90 cm  LV E/e' medial:  20.8 LV SV:         58 ml LVOT Area:     2.84 cm  RIGHT VENTRICLE RV S prime:     14.80 cm/s TAPSE (M-mode): 2.5 cm LEFT ATRIUM              RIGHT ATRIUM LA diam:        4.10 cm  RA Area:     13.50 cm LA Vol (A2C):   101.0 ml RA Volume:   34.00 ml LA Vol (A4C):   98.6 ml LA Biplane Vol: 105.0 ml   AORTA Ao Root diam: 2.60 cm MITRAL VALVE MV Area (PHT): 5.02 cm              SHUNTS MV PHT:        43.79 msec            Systemic Diam: 1.90 cm MV Decel Time: 151 msec MV E velocity: 111.00 cm/s 103 cm/s MV A velocity: 90.40 cm/s  70.3 cm/s MV E/A ratio:  1.23        1.5  Oswaldo Milian MD Electronically signed by Oswaldo Milian MD Signature Date/Time: 06/21/2019/2:41:25 PM    Final     Labs:  CBC: Recent Labs    06/20/19 0612 06/20/19 1945 06/23/19 0548 06/25/19 1600  WBC 15.4* 18.0* 10.7* 10.2  HGB 6.5* 8.4* 7.5* 7.0*  HCT 20.9* 25.9* 24.1* 22.8*  PLT 517* 531* 461* 434*    COAGS: Recent Labs    07/05/18 0555 06/26/19  0534  INR 1.30 1.3*    BMP: Recent Labs    06/18/19 1221 06/20/19 0612 06/23/19 0548 06/26/19 0534  NA 140 141 143 147*  K 3.6 4.0 3.5 3.0*  CL 105 105 104 105  CO2  24 26 27 31   GLUCOSE 206* 104* 91 102*  BUN 86* 88* 99* 99*  CALCIUM 9.3 8.7* 8.7* 8.8*  CREATININE 0.80 0.77 0.83 0.89  GFRNONAA >60 >60 >60 >60  GFRAA >60 >60 >60 >60    LIVER FUNCTION TESTS: Recent Labs    07/05/18 0555 07/07/18 0545 07/18/18 0635 06/02/19 0012 06/04/19 2251  BILITOT 0.8  --   --  0.4 0.1*  AST 24  --   --  21 22  ALT 34  --   --  19 16  ALKPHOS 46  --   --  82 82  PROT 7.0  --   --  5.9* 4.9*  ALBUMIN 2.3* 2.0* 1.9* 1.4* 1.1*    TUMOR MARKERS: No results for input(s): AFPTM, CEA, CA199, CHROMGRNA in the last 8760 hours.  Assessment and Plan:  COPD- trach now Sepsis resolved Dysphagia Malnutrition Need for long term care Scheduled for percutaneous gastric tube placement in IR tentatively for 1/12 Still need consent from family--- will continue to call   Thank you for this interesting consult.  I greatly enjoyed meeting Gerarda GuntherCarolyn F Holstad and look forward to participating in their care.  A copy of this report was sent to the requesting provider on this date.  Electronically Signed: Robet LeuPamela A Angela Platner, PA-C 06/26/2019, 12:36 PM   I spent a total of 40 Minutes    in face to face in clinical consultation, greater than 50% of which was counseling/coordinating care for percutaneous gastric tube placement

## 2019-06-27 ENCOUNTER — Other Ambulatory Visit (HOSPITAL_COMMUNITY): Payer: Medicare Other

## 2019-06-27 DIAGNOSIS — N170 Acute kidney failure with tubular necrosis: Secondary | ICD-10-CM | POA: Diagnosis not present

## 2019-06-27 DIAGNOSIS — J189 Pneumonia, unspecified organism: Secondary | ICD-10-CM | POA: Diagnosis not present

## 2019-06-27 DIAGNOSIS — J9621 Acute and chronic respiratory failure with hypoxia: Secondary | ICD-10-CM | POA: Diagnosis not present

## 2019-06-27 DIAGNOSIS — A419 Sepsis, unspecified organism: Secondary | ICD-10-CM | POA: Diagnosis not present

## 2019-06-27 HISTORY — PX: IR GASTROSTOMY TUBE MOD SED: IMG625

## 2019-06-27 LAB — BASIC METABOLIC PANEL
Anion gap: 10 (ref 5–15)
BUN: 98 mg/dL — ABNORMAL HIGH (ref 6–20)
CO2: 33 mmol/L — ABNORMAL HIGH (ref 22–32)
Calcium: 8.5 mg/dL — ABNORMAL LOW (ref 8.9–10.3)
Chloride: 103 mmol/L (ref 98–111)
Creatinine, Ser: 0.79 mg/dL (ref 0.44–1.00)
GFR calc Af Amer: >60 mL/min (ref >60)
GFR calc non Af Amer: >60 mL/min (ref >60)
Glucose, Bld: 101 mg/dL — ABNORMAL HIGH (ref 70–99)
Potassium: 4.4 mmol/L (ref 3.5–5.1)
Sodium: 146 mmol/L — ABNORMAL HIGH (ref 135–145)

## 2019-06-27 MED ORDER — LIDOCAINE HCL 1 % IJ SOLN
INTRAMUSCULAR | Status: AC | PRN
  Administered 2019-06-27: 8 mL

## 2019-06-27 MED ORDER — MIDAZOLAM HCL 2 MG/2ML IJ SOLN
INTRAMUSCULAR | Status: AC
  Filled 2019-06-27: qty 2

## 2019-06-27 MED ORDER — GLUCAGON HCL RDNA (DIAGNOSTIC) 1 MG IJ SOLR
INTRAMUSCULAR | Status: AC
  Filled 2019-06-27: qty 1

## 2019-06-27 MED ORDER — FENTANYL CITRATE (PF) 100 MCG/2ML IJ SOLN
INTRAMUSCULAR | Status: AC
  Filled 2019-06-27: qty 2

## 2019-06-27 MED ORDER — SODIUM CHLORIDE 0.9 % IV SOLN
INTRAVENOUS | Status: AC | PRN
  Administered 2019-06-27: 10 mL/h via INTRAVENOUS

## 2019-06-27 MED ORDER — GLUCAGON HCL RDNA (DIAGNOSTIC) 1 MG IJ SOLR
INTRAMUSCULAR | Status: AC | PRN
  Administered 2019-06-27: 1 mg via INTRAVENOUS

## 2019-06-27 MED ORDER — CEFAZOLIN SODIUM-DEXTROSE 2-4 GM/100ML-% IV SOLN
INTRAVENOUS | Status: AC
  Filled 2019-06-27: qty 100

## 2019-06-27 MED ORDER — MIDAZOLAM HCL 2 MG/2ML IJ SOLN
INTRAMUSCULAR | Status: AC | PRN
  Administered 2019-06-27: 1 mg via INTRAVENOUS

## 2019-06-27 MED ORDER — FENTANYL CITRATE (PF) 100 MCG/2ML IJ SOLN
INTRAMUSCULAR | Status: AC | PRN
  Administered 2019-06-27: 25 ug via INTRAVENOUS

## 2019-06-27 MED ORDER — CEFAZOLIN SODIUM-DEXTROSE 1-4 GM/50ML-% IV SOLN
INTRAVENOUS | Status: AC | PRN
  Administered 2019-06-27: 2 g via INTRAVENOUS

## 2019-06-27 NOTE — Progress Notes (Signed)
Pulmonary Critical Care Medicine [facility] [place]   PULMONARY CRITICAL CARE SERVICE  PROGRESS NOTE  Date of Service: 06/27/2019  Cristina Norris  XBM:[id number]  DOB: [birth year]   DOA: 06/01/2019  Referring Physician: Carron Curie, MD  HPI: Cristina Norris is a 54 y.o. female seen for follow up of Acute on Chronic Respiratory Failure.  Patient is currently on full support on assist control mode oxygen has been decreased down to 28% FiO2 spoke with the primary care team apparently is supposed to have a PEG placed today.  Respiratory therapy is going to assess the patient for weaning readiness.  If she is able to we will try her on a pressure support wean  Medications: Reviewed on Rounds  Physical Exam:  Vitals: Temperature 97.8 pulse 84 respiratory 18 blood pressure is 148/87 saturations 100%  Ventilator Settings mode ventilation assist control FiO2 28% PEEP 7 respiratory rate 10 tidal volume 420  . General: Comfortable at this time . Eyes: Grossly normal lids, irises & conjunctiva . ENT: grossly tongue is normal . Neck: no obvious mass . Cardiovascular: S1 S2 normal no gallop . Respiratory: Scattered rhonchi expansion is equal at this time . Abdomen: soft . Skin: no rash seen on limited exam . Musculoskeletal: not rigid . Psychiatric:unable to assess . Neurologic: no seizure no involuntary movements         Lab Data:   Basic Metabolic Panel: Recent Labs  Lab 06/23/19 0548 06/26/19 0534 06/27/19 0723  NA 143 147* 146*  K 3.5 3.0* 4.4  CL 104 105 103  CO2 27 31 33*  GLUCOSE 91 102* 101*  BUN 99* 99* 98*  CREATININE 0.83 0.89 0.79  CALCIUM 8.7* 8.8* 8.5*    ABG: No results for input(s): PHART, PCO2ART, PO2ART, HCO3, O2SAT in the last 168 hours.  Liver Function Tests: No results for input(s): AST, ALT, ALKPHOS, BILITOT, PROT, ALBUMIN in the last 168 hours. No results for input(s): LIPASE, AMYLASE in the last 168 hours. No results for input(s):  AMMONIA in the last 168 hours.  CBC: Recent Labs  Lab 06/20/19 1945 06/23/19 0548 06/25/19 1600  WBC 18.0* 10.7* 10.2  NEUTROABS  --   --  5.8  HGB 8.4* 7.5* 7.0*  HCT 25.9* 24.1* 22.8*  MCV 94.2 96.4 96.6  PLT 531* 461* 434*    Cardiac Enzymes: No results for input(s): CKTOTAL, CKMB, CKMBINDEX, TROPONINI in the last 168 hours.  BNP (last 3 results) Recent Labs    07/06/18 0628  BNP 1,109.4*    ProBNP (last 3 results) No results for input(s): PROBNP in the last 8760 hours.  Radiological Exams: No results found.  Assessment/Plan Active Problems:   Acute on chronic respiratory failure with hypoxia (HCC)   Acute tubular necrosis (HCC)   Chronic atrial fibrillation with rapid ventricular response (HCC)   Severe sepsis (HCC)   Healthcare-associated pneumonia   Chronic systolic CHF (congestive heart failure), NYHA class 3 (HCC)   1. Acute on chronic respiratory failure hypoxia plan is to continue with full support on the ventilator try pressure support today if she is able to tolerate.  If she goes for the PEG tube then will hold off until tomorrow 2. Acute tubular necrosis we will continue to monitor labs closely 3. Chronic atrial fibrillation rate is controlled at this time 4. Severe sepsis resolved 5. Healthcare associated pneumonia treated we will continue to follow 6. Chronic systolic heart failure appears to be improving   I have personally seen  and evaluated the patient, evaluated laboratory and imaging results, formulated the assessment and plan and placed orders. The Patient requires high complexity decision making with multiple systems involvement.  Rounds were done with the Respiratory Therapy Director and Staff therapists and discussed with nursing staff also.  Allyne Gee, MD [facility] Pulmonary Critical Care Medicine Sleep Medicine

## 2019-06-27 NOTE — Procedures (Signed)
Pre procedure Dx: Dysphagia Post Procedure Dx: Same  Successful fluoroscopic guided insertion of gastrostomy tube.   The gastrostomy tube may be used immediately for medications.   Tube feeds may be initiated in 24 hours as per the primary team.    EBL: Minimal  Complications: None immediate  Jay Nyelli Samara, MD Pager #: [phone]    

## 2019-06-28 DIAGNOSIS — J9621 Acute and chronic respiratory failure with hypoxia: Secondary | ICD-10-CM | POA: Diagnosis not present

## 2019-06-28 DIAGNOSIS — N170 Acute kidney failure with tubular necrosis: Secondary | ICD-10-CM | POA: Diagnosis not present

## 2019-06-28 DIAGNOSIS — J189 Pneumonia, unspecified organism: Secondary | ICD-10-CM | POA: Diagnosis not present

## 2019-06-28 DIAGNOSIS — I482 Chronic atrial fibrillation, unspecified: Secondary | ICD-10-CM | POA: Diagnosis not present

## 2019-06-28 NOTE — Progress Notes (Signed)
Referring Physician(s): Dr. Sharyon Medicus  Supervising Physician: Richarda Overlie  Patient Status:  [facility] - In-pt  Chief Complaint: Dysphagia, vent-dependence, long-term care  Subjective: Minimal interaction.  On vent.  Only grimaces to manipulation of her left arm to assess gastrostomy tube.  Allergies: Patient has no allergy information on record.  Medications: Prior to Admission medications   Not on File     Vital Signs: BP (!) 141/90   Pulse 88   SpO2 100%   Physical Exam  NAD, alert Abdomen: soft, non-tender, non-distended.  Insertion site intact, clean, and dry. No bleeding or erythema.   Imaging: IR GASTROSTOMY TUBE MOD SED  Result Date: 06/27/2019 INDICATION: Dysphagia. Please place percutaneous gastrostomy tube for enteric nutrition supplementation purposes. EXAM: PULL TROUGH GASTROSTOMY TUBE PLACEMENT COMPARISON:  Abdominal CT - 06/23/2019 MEDICATIONS: Ancef 2 gm IV; Antibiotics were administered within 1 hour of the procedure. Glucagon 1 mg IV CONTRAST:  20 cc mL of Omnipaque 300 administered into the gastric lumen. ANESTHESIA/SEDATION: Moderate (conscious) sedation was employed during this procedure. A total of Versed 1 mg and Fentanyl 25 mcg was administered intravenously. Moderate Sedation Time: 16 minutes. The patient's level of consciousness and vital signs were monitored continuously by radiology nursing throughout the procedure under my direct supervision. FLUOROSCOPY TIME:  30 seconds (2 mGy) COMPLICATIONS: None immediate. PROCEDURE: Informed written consent was obtained from the patient's family following explanation of the procedure, risks, benefits and alternatives. A time out was performed prior to the initiation of the procedure. Ultrasound scanning was performed to demarcate the edge of the left lobe of the liver. Maximal barrier sterile technique utilized including caps, mask, sterile gowns, sterile gloves, large sterile drape, hand hygiene and Betadine prep. The  left upper quadrant was sterilely prepped and draped. An oral gastric catheter was inserted into the stomach under fluoroscopy. The existing nasogastric feeding tube was removed. The left costal margin and air opacified transverse colon were identified and avoided. Air was injected into the stomach for insufflation and visualization under fluoroscopy. Under sterile conditions a 17 gauge trocar needle was utilized to access the stomach percutaneously beneath the left subcostal margin after the overlying soft tissues were anesthetized with 1% Lidocaine with epinephrine. Needle position was confirmed within the stomach with aspiration of air and injection of small amount of contrast. A single T tack was deployed for gastropexy. Over an Amplatz guide wire, a 9-French sheath was inserted into the stomach. A snare device was utilized to capture the oral gastric catheter. The snare device was pulled retrograde from the stomach up the esophagus and out the oropharynx. The 20-French pull-through gastrostomy was connected to the snare device and pulled antegrade through the oropharynx down the esophagus into the stomach and then through the percutaneous tract external to the patient. The gastrostomy was assembled externally. Contrast injection confirms position in the stomach. Several spot radiographic images were obtained in various obliquities for documentation. The patient tolerated procedure well without immediate post procedural complication. FINDINGS: After successful fluoroscopic guided placement, the gastrostomy tube is appropriately positioned with internal disc against the ventral aspect of the gastric lumen. IMPRESSION: Successful fluoroscopic insertion of a 20-French pull-through gastrostomy tube. The gastrostomy may be used immediately for medication administration and in 24 hrs for the initiation of feeds. Electronically Signed   By: Simonne Come M.D.   On: 06/27/2019 14:56    Labs:  CBC: Recent Labs     06/20/19 0612 06/20/19 1945 06/23/19 0548 06/25/19 1600  WBC 15.4* 18.0*  10.7* 10.2  HGB 6.5* 8.4* 7.5* 7.0*  HCT 20.9* 25.9* 24.1* 22.8*  PLT 517* 531* 461* 434*    COAGS: Recent Labs    07/05/18 0555 06/26/19 0534  INR 1.30 1.3*    BMP: Recent Labs    06/20/19 0612 06/23/19 0548 06/26/19 0534 06/27/19 0723  NA 141 143 147* 146*  K 4.0 3.5 3.0* 4.4  CL 105 104 105 103  CO2 26 27 31  33*  GLUCOSE 104* 91 102* 101*  BUN 88* 99* 99* 98*  CALCIUM 8.7* 8.7* 8.8* 8.5*  CREATININE 0.77 0.83 0.89 0.79  GFRNONAA >60 >60 >60 >60  GFRAA >60 >60 >60 >60    LIVER FUNCTION TESTS: Recent Labs    07/05/18 0555 07/07/18 0545 07/18/18 0635 06/02/19 0012 06/04/19 2251  BILITOT 0.8  --   --  0.4 0.1*  AST 24  --   --  21 22  ALT 34  --   --  19 16  ALKPHOS 46  --   --  82 82  PROT 7.0  --   --  5.9* 4.9*  ALBUMIN 2.3* 2.0* 1.9* 1.4* 1.1*    Assessment and Plan: Dysphagia Patient s/p gastrostomy tube placement by Dr. Pascal Lux 1/12. Insertion site I/c/d.  [facility] for use 24 hrs post-placement.   Electronically Signed: Docia Barrier, PA 06/28/2019, 11:45 AM   I spent a total of 15 Minutes at the the patient's bedside AND on the patient's hospital floor or unit, greater than 50% of which was counseling/coordinating care for dysphagia.

## 2019-06-28 NOTE — Progress Notes (Signed)
Pulmonary Critical Care Medicine [facility] [place]   PULMONARY CRITICAL CARE SERVICE  PROGRESS NOTE  Date of Service: 06/28/2019  BETHANN QUALLEY  WLS:[id number]  DOB: [birth year]   DOA: 06/01/2019  Referring Physician: Carron Curie, MD  HPI: Cristina Norris is a 54 y.o. female seen for follow up of Acute on Chronic Respiratory Failure.  Patient currently is on pressure support mode has been on 28% FiO2 the goal is to try to wean for about 12 hours  Medications: Reviewed on Rounds  Physical Exam:  Vitals: Temperature 97.1 pulse 81 respiratory rate 16 blood pressure is 155/87 saturations 100%  Ventilator Settings mode ventilation pressure support FiO2 28% pressure 12 PEEP 7  . General: Comfortable at this time . Eyes: Grossly normal lids, irises & conjunctiva . ENT: grossly tongue is normal . Neck: no obvious mass . Cardiovascular: S1 S2 normal no gallop . Respiratory: No rhonchi no rales are noted at this time . Abdomen: soft . Skin: no rash seen on limited exam . Musculoskeletal: not rigid . Psychiatric:unable to assess . Neurologic: no seizure no involuntary movements         Lab Data:   Basic Metabolic Panel: Recent Labs  Lab 06/23/19 0548 06/26/19 0534 06/27/19 0723  NA 143 147* 146*  K 3.5 3.0* 4.4  CL 104 105 103  CO2 27 31 33*  GLUCOSE 91 102* 101*  BUN 99* 99* 98*  CREATININE 0.83 0.89 0.79  CALCIUM 8.7* 8.8* 8.5*    ABG: No results for input(s): PHART, PCO2ART, PO2ART, HCO3, O2SAT in the last 168 hours.  Liver Function Tests: No results for input(s): AST, ALT, ALKPHOS, BILITOT, PROT, ALBUMIN in the last 168 hours. No results for input(s): LIPASE, AMYLASE in the last 168 hours. No results for input(s): AMMONIA in the last 168 hours.  CBC: Recent Labs  Lab 06/23/19 0548 06/25/19 1600  WBC 10.7* 10.2  NEUTROABS  --  5.8  HGB 7.5* 7.0*  HCT 24.1* 22.8*  MCV 96.4 96.6  PLT 461* 434*    Cardiac Enzymes: No results for  input(s): CKTOTAL, CKMB, CKMBINDEX, TROPONINI in the last 168 hours.  BNP (last 3 results) Recent Labs    07/06/18 0628  BNP 1,109.4*    ProBNP (last 3 results) No results for input(s): PROBNP in the last 8760 hours.  Radiological Exams: IR GASTROSTOMY TUBE MOD SED  Result Date: 06/27/2019 INDICATION: Dysphagia. Please place percutaneous gastrostomy tube for enteric nutrition supplementation purposes. EXAM: PULL TROUGH GASTROSTOMY TUBE PLACEMENT COMPARISON:  Abdominal CT - 06/23/2019 MEDICATIONS: Ancef 2 gm IV; Antibiotics were administered within 1 hour of the procedure. Glucagon 1 mg IV CONTRAST:  20 cc mL of Omnipaque 300 administered into the gastric lumen. ANESTHESIA/SEDATION: Moderate (conscious) sedation was employed during this procedure. A total of Versed 1 mg and Fentanyl 25 mcg was administered intravenously. Moderate Sedation Time: 16 minutes. The patient's level of consciousness and vital signs were monitored continuously by radiology nursing throughout the procedure under my direct supervision. FLUOROSCOPY TIME:  30 seconds (2 mGy) COMPLICATIONS: None immediate. PROCEDURE: Informed written consent was obtained from the patient's family following explanation of the procedure, risks, benefits and alternatives. A time out was performed prior to the initiation of the procedure. Ultrasound scanning was performed to demarcate the edge of the left lobe of the liver. Maximal barrier sterile technique utilized including caps, mask, sterile gowns, sterile gloves, large sterile drape, hand hygiene and Betadine prep. The left upper quadrant was sterilely prepped and  draped. An oral gastric catheter was inserted into the stomach under fluoroscopy. The existing nasogastric feeding tube was removed. The left costal margin and air opacified transverse colon were identified and avoided. Air was injected into the stomach for insufflation and visualization under fluoroscopy. Under sterile conditions a 17  gauge trocar needle was utilized to access the stomach percutaneously beneath the left subcostal margin after the overlying soft tissues were anesthetized with 1% Lidocaine with epinephrine. Needle position was confirmed within the stomach with aspiration of air and injection of small amount of contrast. A single T tack was deployed for gastropexy. Over an Amplatz guide wire, a 9-French sheath was inserted into the stomach. A snare device was utilized to capture the oral gastric catheter. The snare device was pulled retrograde from the stomach up the esophagus and out the oropharynx. The 20-French pull-through gastrostomy was connected to the snare device and pulled antegrade through the oropharynx down the esophagus into the stomach and then through the percutaneous tract external to the patient. The gastrostomy was assembled externally. Contrast injection confirms position in the stomach. Several spot radiographic images were obtained in various obliquities for documentation. The patient tolerated procedure well without immediate post procedural complication. FINDINGS: After successful fluoroscopic guided placement, the gastrostomy tube is appropriately positioned with internal disc against the ventral aspect of the gastric lumen. IMPRESSION: Successful fluoroscopic insertion of a 20-French pull-through gastrostomy tube. The gastrostomy may be used immediately for medication administration and in 24 hrs for the initiation of feeds. Electronically Signed   By: Sandi Mariscal M.D.   On: 06/27/2019 14:56    Assessment/Plan Active Problems:   Acute on chronic respiratory failure with hypoxia (HCC)   Acute tubular necrosis (HCC)   Chronic atrial fibrillation with rapid ventricular response (HCC)   Severe sepsis (HCC)   Healthcare-associated pneumonia   Chronic systolic CHF (congestive heart failure), NYHA class 3 ([place])   1. Acute on chronic respiratory failure hypoxia plan continue with full support on  pressure support and wean for 12 hours spoke with the respiratory therapy during rounds if the patient be able to extend that more aggressively as she had previously been on T collar 2. Acute tubular necrosis no change we will continue with supportive care 3. Chronic atrial fibrillation rate controlled 4. Severe sepsis hemodynamics are stable 5. Healthcare associated pneumonia treated clinically improving 6. Chronic systolic heart failure baseline we will continue to follow along   I have personally seen and evaluated the patient, evaluated laboratory and imaging results, formulated the assessment and plan and placed orders. The Patient requires high complexity decision making with multiple systems involvement.  Time 35 minutes Rounds were done with the Respiratory Therapy Director and Staff therapists and discussed with nursing staff also.  Allyne Gee, MD [facility] Pulmonary Critical Care Medicine Sleep Medicine

## 2019-06-29 DIAGNOSIS — J189 Pneumonia, unspecified organism: Secondary | ICD-10-CM | POA: Diagnosis not present

## 2019-06-29 DIAGNOSIS — A419 Sepsis, unspecified organism: Secondary | ICD-10-CM | POA: Diagnosis not present

## 2019-06-29 DIAGNOSIS — J9621 Acute and chronic respiratory failure with hypoxia: Secondary | ICD-10-CM | POA: Diagnosis not present

## 2019-06-29 DIAGNOSIS — N170 Acute kidney failure with tubular necrosis: Secondary | ICD-10-CM | POA: Diagnosis not present

## 2019-06-29 LAB — CBC
HCT: 23.5 % — ABNORMAL LOW (ref 36.0–46.0)
Hemoglobin: 7.2 g/dL — ABNORMAL LOW (ref 12.0–15.0)
MCH: 29.9 pg (ref 26.0–34.0)
MCHC: 30.6 g/dL (ref 30.0–36.0)
MCV: 97.5 fL (ref 80.0–100.0)
Platelets: 529 10*3/uL — ABNORMAL HIGH (ref 150–400)
RBC: 2.41 MIL/uL — ABNORMAL LOW (ref 3.87–5.11)
RDW: 16 % — ABNORMAL HIGH (ref 11.5–15.5)
WBC: 12.7 10*3/uL — ABNORMAL HIGH (ref 4.0–10.5)
nRBC: 0 % (ref 0.0–0.2)

## 2019-06-29 LAB — BASIC METABOLIC PANEL
Anion gap: 10 (ref 5–15)
BUN: 89 mg/dL — ABNORMAL HIGH (ref 6–20)
CO2: 32 mmol/L (ref 22–32)
Calcium: 8.7 mg/dL — ABNORMAL LOW (ref 8.9–10.3)
Chloride: 107 mmol/L (ref 98–111)
Creatinine, Ser: 0.94 mg/dL (ref 0.44–1.00)
GFR calc Af Amer: >60 mL/min (ref >60)
GFR calc non Af Amer: >60 mL/min (ref >60)
Glucose, Bld: 122 mg/dL — ABNORMAL HIGH (ref 70–99)
Potassium: 4 mmol/L (ref 3.5–5.1)
Sodium: 149 mmol/L — ABNORMAL HIGH (ref 135–145)

## 2019-06-29 LAB — MAGNESIUM: Magnesium: 1.9 mg/dL (ref 1.7–2.4)

## 2019-06-29 NOTE — Progress Notes (Signed)
Pulmonary Critical Care Medicine [place]   PULMONARY CRITICAL CARE SERVICE  PROGRESS NOTE  Date of Service: 06/29/2019  Cristina Norris  NWG:[id number]  DOB: 01/10/1966   DOA: 06/01/2019  Referring Physician: Merton Border, MD  HPI: Cristina Norris is a 54 y.o. female seen for follow up of Acute on Chronic Respiratory Failure.  Patient is weaning pressure support was able to do 16-hour goal right now is on 12/5 plan is to continue to advance her aggressively and get back to T collar  Medications: Reviewed on Rounds  Physical Exam:  Vitals: Temperature 99.2 pulse 95 respiratory rate 13 blood pressure is 151/79 saturations 100%  Ventilator Settings on pressure support FiO2 28% pressure 12 PEEP 5 with 16-hour goal  . General: Comfortable at this time . Eyes: Grossly normal lids, irises & conjunctiva . ENT: grossly tongue is normal . Neck: no obvious mass . Cardiovascular: S1 S2 normal no gallop . Respiratory: No rhonchi no rales are noted at this time . Abdomen: soft . Skin: no rash seen on limited exam . Musculoskeletal: not rigid . Psychiatric:unable to assess . Neurologic: no seizure no involuntary movements         Lab Data:   Basic Metabolic Panel: Recent Labs  Lab 06/23/19 0548 06/26/19 0534 06/27/19 0723  NA 143 147* 146*  K 3.5 3.0* 4.4  CL 104 105 103  CO2 27 31 33*  GLUCOSE 91 102* 101*  BUN 99* 99* 98*  CREATININE 0.83 0.89 0.79  CALCIUM 8.7* 8.8* 8.5*    ABG: No results for input(s): PHART, PCO2ART, PO2ART, HCO3, O2SAT in the last 168 hours.  Liver Function Tests: No results for input(s): AST, ALT, ALKPHOS, BILITOT, PROT, ALBUMIN in the last 168 hours. No results for input(s): LIPASE, AMYLASE in the last 168 hours. No results for input(s): AMMONIA in the last 168 hours.  CBC: Recent Labs  Lab 06/23/19 0548 06/25/19 1600  WBC 10.7* 10.2  NEUTROABS  --  5.8  HGB 7.5* 7.0*  HCT 24.1* 22.8*  MCV 96.4 96.6  PLT 461*  434*    Cardiac Enzymes: No results for input(s): CKTOTAL, CKMB, CKMBINDEX, TROPONINI in the last 168 hours.  BNP (last 3 results) Recent Labs    07/06/18 0628  BNP 1,109.4*    ProBNP (last 3 results) No results for input(s): PROBNP in the last 8760 hours.  Radiological Exams: IR GASTROSTOMY TUBE MOD SED  Result Date: 06/27/2019 INDICATION: Dysphagia. Please place percutaneous gastrostomy tube for enteric nutrition supplementation purposes. EXAM: PULL TROUGH GASTROSTOMY TUBE PLACEMENT COMPARISON:  Abdominal CT - 06/23/2019 MEDICATIONS: Ancef 2 gm IV; Antibiotics were administered within 1 hour of the procedure. Glucagon 1 mg IV CONTRAST:  20 cc mL of Omnipaque 300 administered into the gastric lumen. ANESTHESIA/SEDATION: Moderate (conscious) sedation was employed during this procedure. A total of Versed 1 mg and Fentanyl 25 mcg was administered intravenously. Moderate Sedation Time: 16 minutes. The patient's level of consciousness and vital signs were monitored continuously by radiology nursing throughout the procedure under my direct supervision. FLUOROSCOPY TIME:  30 seconds (2 mGy) COMPLICATIONS: None immediate. PROCEDURE: Informed written consent was obtained from the patient's family following explanation of the procedure, risks, benefits and alternatives. A time out was performed prior to the initiation of the procedure. Ultrasound scanning was performed to demarcate the edge of the left lobe of the liver. Maximal barrier sterile technique utilized including caps, mask, sterile gowns, sterile gloves, large sterile drape, hand hygiene and Betadine  prep. The left upper quadrant was sterilely prepped and draped. An oral gastric catheter was inserted into the stomach under fluoroscopy. The existing nasogastric feeding tube was removed. The left costal margin and air opacified transverse colon were identified and avoided. Air was injected into the stomach for insufflation and visualization under  fluoroscopy. Under sterile conditions a 17 gauge trocar needle was utilized to access the stomach percutaneously beneath the left subcostal margin after the overlying soft tissues were anesthetized with 1% Lidocaine with epinephrine. Needle position was confirmed within the stomach with aspiration of air and injection of small amount of contrast. A single T tack was deployed for gastropexy. Over an Amplatz guide wire, a 9-French sheath was inserted into the stomach. A snare device was utilized to capture the oral gastric catheter. The snare device was pulled retrograde from the stomach up the esophagus and out the oropharynx. The 20-French pull-through gastrostomy was connected to the snare device and pulled antegrade through the oropharynx down the esophagus into the stomach and then through the percutaneous tract external to the patient. The gastrostomy was assembled externally. Contrast injection confirms position in the stomach. Several spot radiographic images were obtained in various obliquities for documentation. The patient tolerated procedure well without immediate post procedural complication. FINDINGS: After successful fluoroscopic guided placement, the gastrostomy tube is appropriately positioned with internal disc against the ventral aspect of the gastric lumen. IMPRESSION: Successful fluoroscopic insertion of a 20-French pull-through gastrostomy tube. The gastrostomy may be used immediately for medication administration and in 24 hrs for the initiation of feeds. Electronically Signed   By: Simonne Come M.D.   On: 06/27/2019 14:56    Assessment/Plan Active Problems:   Acute on chronic respiratory failure with hypoxia (HCC)   Acute tubular necrosis (HCC)   Chronic atrial fibrillation with rapid ventricular response (HCC)   Severe sepsis (HCC)   Healthcare-associated pneumonia   Chronic systolic CHF (congestive heart failure), NYHA class 3 (HCC)   1. Acute on chronic respiratory failure  hypoxia plan is to continue with weaning protocol hopefully will be able to get her back to T collar.  Patient had a PEG done we will monitor closely 2. Acute tubular necrosis no changes right now labs going back to baseline 3. Chronic atrial fibrillation rate is controlled 4. Severe sepsis hemodynamics are stable 5. Healthcare associated pneumonia treated we will continue with present management 6. Chronic systolic heart failure compensated at this time continue with supportive care   I have personally seen and evaluated the patient, evaluated laboratory and imaging results, formulated the assessment and plan and placed orders. The Patient requires high complexity decision making with multiple systems involvement.  Rounds were done with the Respiratory Therapy Director and Staff therapists and discussed with nursing staff also.  Yevonne Pax, MD [facility] Pulmonary Critical Care Medicine Sleep Medicine

## 2019-06-30 DIAGNOSIS — N170 Acute kidney failure with tubular necrosis: Secondary | ICD-10-CM | POA: Diagnosis not present

## 2019-06-30 DIAGNOSIS — J9621 Acute and chronic respiratory failure with hypoxia: Secondary | ICD-10-CM | POA: Diagnosis not present

## 2019-06-30 DIAGNOSIS — A419 Sepsis, unspecified organism: Secondary | ICD-10-CM | POA: Diagnosis not present

## 2019-06-30 DIAGNOSIS — J189 Pneumonia, unspecified organism: Secondary | ICD-10-CM | POA: Diagnosis not present

## 2019-06-30 NOTE — Progress Notes (Signed)
Pulmonary Critical Care Medicine [facility] [place]   PULMONARY CRITICAL CARE SERVICE  PROGRESS NOTE  Date of Service: 06/30/2019  Cristina Norris  VVO:[id number]  DOB: 1966/05/28   DOA: 06/01/2019  Referring Physician: Carron Curie, MD  HPI: Cristina Norris is a 54 y.o. female seen for follow up of Acute on Chronic Respiratory Failure.  Patient currently is on T collar has been on 28% FiO2 the goal is for 8 hours  Medications: Reviewed on Rounds  Physical Exam:  Vitals: Temperature 98.7 pulse 77 respiratory 21 blood pressure 140/79 saturations 100%  Ventilator Settings off the ventilator right now on T collar with an FiO2 of 28%  . General: Comfortable at this time . Eyes: Grossly normal lids, irises & conjunctiva . ENT: grossly tongue is normal . Neck: no obvious mass . Cardiovascular: S1 S2 normal no gallop . Respiratory: No rhonchi no rales are noted at this time . Abdomen: soft . Skin: no rash seen on limited exam . Musculoskeletal: not rigid . Psychiatric:unable to assess . Neurologic: no seizure no involuntary movements         Lab Data:   Basic Metabolic Panel: Recent Labs  Lab 06/26/19 0534 06/27/19 0723 06/29/19 1018  NA 147* 146* 149*  K 3.0* 4.4 4.0  CL 105 103 107  CO2 31 33* 32  GLUCOSE 102* 101* 122*  BUN 99* 98* 89*  CREATININE 0.89 0.79 0.94  CALCIUM 8.8* 8.5* 8.7*  MG  --   --  1.9    ABG: No results for input(s): PHART, PCO2ART, PO2ART, HCO3, O2SAT in the last 168 hours.  Liver Function Tests: No results for input(s): AST, ALT, ALKPHOS, BILITOT, PROT, ALBUMIN in the last 168 hours. No results for input(s): LIPASE, AMYLASE in the last 168 hours. No results for input(s): AMMONIA in the last 168 hours.  CBC: Recent Labs  Lab 06/25/19 1600 06/29/19 1018  WBC 10.2 12.7*  NEUTROABS 5.8  --   HGB 7.0* 7.2*  HCT 22.8* 23.5*  MCV 96.6 97.5  PLT 434* 529*    Cardiac Enzymes: No results for input(s): CKTOTAL, CKMB,  CKMBINDEX, TROPONINI in the last 168 hours.  BNP (last 3 results) Recent Labs    07/06/18 0628  BNP 1,109.4*    ProBNP (last 3 results) No results for input(s): PROBNP in the last 8760 hours.  Radiological Exams: No results found.  Assessment/Plan Active Problems:   Acute on chronic respiratory failure with hypoxia (HCC)   Acute tubular necrosis (HCC)   Chronic atrial fibrillation with rapid ventricular response (HCC)   Severe sepsis (HCC)   Healthcare-associated pneumonia   Chronic systolic CHF (congestive heart failure), NYHA class 3 (HCC)   1. Acute on chronic respiratory failure with hypoxia plan is to continue with weaning on T collar trials as tolerated.  Patient currently is on 8-hour goal 2. ATN resolving labs are improved we will continue with supportive care 3. Chronic atrial fibrillation rate controlled 4. Severe sepsis hemodynamics are stable 5. Healthcare associated pneumonia treated 6. Chronic systolic heart failure compensated   I have personally seen and evaluated the patient, evaluated laboratory and imaging results, formulated the assessment and plan and placed orders. The Patient requires high complexity decision making with multiple systems involvement.  Rounds were done with the Respiratory Therapy Director and Staff therapists and discussed with nursing staff also.  Yevonne Pax, MD [facility] Pulmonary Critical Care Medicine Sleep Medicine

## 2019-07-01 DIAGNOSIS — J189 Pneumonia, unspecified organism: Secondary | ICD-10-CM | POA: Diagnosis not present

## 2019-07-01 DIAGNOSIS — A419 Sepsis, unspecified organism: Secondary | ICD-10-CM | POA: Diagnosis not present

## 2019-07-01 DIAGNOSIS — N170 Acute kidney failure with tubular necrosis: Secondary | ICD-10-CM | POA: Diagnosis not present

## 2019-07-01 DIAGNOSIS — J9621 Acute and chronic respiratory failure with hypoxia: Secondary | ICD-10-CM | POA: Diagnosis not present

## 2019-07-01 LAB — BASIC METABOLIC PANEL
Anion gap: 9 (ref 5–15)
BUN: 85 mg/dL — ABNORMAL HIGH (ref 6–20)
CO2: 34 mmol/L — ABNORMAL HIGH (ref 22–32)
Calcium: 8.8 mg/dL — ABNORMAL LOW (ref 8.9–10.3)
Chloride: 108 mmol/L (ref 98–111)
Creatinine, Ser: 0.95 mg/dL (ref 0.44–1.00)
GFR calc Af Amer: >60 mL/min (ref >60)
GFR calc non Af Amer: >60 mL/min (ref >60)
Glucose, Bld: 110 mg/dL — ABNORMAL HIGH (ref 70–99)
Potassium: 3.9 mmol/L (ref 3.5–5.1)
Sodium: 151 mmol/L — ABNORMAL HIGH (ref 135–145)

## 2019-07-01 LAB — CBC
HCT: 24 % — ABNORMAL LOW (ref 36.0–46.0)
Hemoglobin: 7.2 g/dL — ABNORMAL LOW (ref 12.0–15.0)
MCH: 29.5 pg (ref 26.0–34.0)
MCHC: 30 g/dL (ref 30.0–36.0)
MCV: 98.4 fL (ref 80.0–100.0)
Platelets: 497 10*3/uL — ABNORMAL HIGH (ref 150–400)
RBC: 2.44 MIL/uL — ABNORMAL LOW (ref 3.87–5.11)
RDW: 15.6 % — ABNORMAL HIGH (ref 11.5–15.5)
WBC: 12.6 10*3/uL — ABNORMAL HIGH (ref 4.0–10.5)
nRBC: 0 % (ref 0.0–0.2)

## 2019-07-01 NOTE — Progress Notes (Addendum)
Pulmonary Critical Care Medicine [facility] [place]   PULMONARY CRITICAL CARE SERVICE  PROGRESS NOTE  Date of Service: 07/01/2019  AADHIRA HEFFERNAN  LZJ:[id number]  DOB: 12-Jan-1966   DOA: 06/01/2019  Referring Physician: Carron Curie, MD  HPI: Cristina Norris is a 54 y.o. female seen for follow up of Acute on Chronic Respiratory Failure.  Patient has a 16-hour goal today on 20% aerosol trach collar satting well this time with no distress.  Medications: Reviewed on Rounds  Physical Exam:  Vitals: Pulse 80 respirations 21 BP 140/82 O2 sat 100% temp 98.5  Ventilator Settings 28% ATC  . General: Comfortable at this time . Eyes: Grossly normal lids, irises & conjunctiva . ENT: grossly tongue is normal . Neck: no obvious mass . Cardiovascular: S1 S2 normal no gallop . Respiratory: No rales or rhonchi noted . Abdomen: soft . Skin: no rash seen on limited exam . Musculoskeletal: not rigid . Psychiatric:unable to assess . Neurologic: no seizure no involuntary movements         Lab Data:   Basic Metabolic Panel: Recent Labs  Lab 06/26/19 0534 06/27/19 0723 06/29/19 1018 07/01/19 0703  NA 147* 146* 149* 151*  K 3.0* 4.4 4.0 3.9  CL 105 103 107 108  CO2 31 33* 32 34*  GLUCOSE 102* 101* 122* 110*  BUN 99* 98* 89* 85*  CREATININE 0.89 0.79 0.94 0.95  CALCIUM 8.8* 8.5* 8.7* 8.8*  MG  --   --  1.9  --     ABG: No results for input(s): PHART, PCO2ART, PO2ART, HCO3, O2SAT in the last 168 hours.  Liver Function Tests: No results for input(s): AST, ALT, ALKPHOS, BILITOT, PROT, ALBUMIN in the last 168 hours. No results for input(s): LIPASE, AMYLASE in the last 168 hours. No results for input(s): AMMONIA in the last 168 hours.  CBC: Recent Labs  Lab 06/25/19 1600 06/29/19 1018 07/01/19 0703  WBC 10.2 12.7* 12.6*  NEUTROABS 5.8  --   --   HGB 7.0* 7.2* 7.2*  HCT 22.8* 23.5* 24.0*  MCV 96.6 97.5 98.4  PLT 434* 529* 497*    Cardiac Enzymes: No results  for input(s): CKTOTAL, CKMB, CKMBINDEX, TROPONINI in the last 168 hours.  BNP (last 3 results) Recent Labs    07/06/18 0628  BNP 1,109.4*    ProBNP (last 3 results) No results for input(s): PROBNP in the last 8760 hours.  Radiological Exams: No results found.  Assessment/Plan Active Problems:   Acute on chronic respiratory failure with hypoxia (HCC)   Acute tubular necrosis (HCC)   Chronic atrial fibrillation with rapid ventricular response (HCC)   Severe sepsis (HCC)   Healthcare-associated pneumonia   Chronic systolic CHF (congestive heart failure), NYHA class 3 (HCC)   1. Acute on chronic respiratory failure with hypoxia plan is to continue with weaning on T collar trials as tolerated.  Patient currently is on 16-hour goal 2. ATN resolving labs are improved we will continue with supportive care 3. Chronic atrial fibrillation rate controlled 4. Severe sepsis hemodynamics are stable 5. Healthcare associated pneumonia treated 6. Chronic systolic heart failure compensated   I have personally seen and evaluated the patient, evaluated laboratory and imaging results, formulated the assessment and plan and placed orders. The Patient requires high complexity decision making with multiple systems involvement.  Rounds were done with the Respiratory Therapy Director and Staff therapists and discussed with nursing staff also.  Yevonne Pax, MD [facility] Pulmonary Critical Care Medicine Sleep Medicine

## 2019-07-02 DIAGNOSIS — N170 Acute kidney failure with tubular necrosis: Secondary | ICD-10-CM | POA: Diagnosis not present

## 2019-07-02 DIAGNOSIS — A419 Sepsis, unspecified organism: Secondary | ICD-10-CM | POA: Diagnosis not present

## 2019-07-02 DIAGNOSIS — J189 Pneumonia, unspecified organism: Secondary | ICD-10-CM | POA: Diagnosis not present

## 2019-07-02 DIAGNOSIS — J9621 Acute and chronic respiratory failure with hypoxia: Secondary | ICD-10-CM | POA: Diagnosis not present

## 2019-07-02 LAB — BASIC METABOLIC PANEL
Anion gap: 11 (ref 5–15)
BUN: 85 mg/dL — ABNORMAL HIGH (ref 6–20)
CO2: 31 mmol/L (ref 22–32)
Calcium: 8.7 mg/dL — ABNORMAL LOW (ref 8.9–10.3)
Chloride: 106 mmol/L (ref 98–111)
Creatinine, Ser: 0.99 mg/dL (ref 0.44–1.00)
GFR calc Af Amer: >60 mL/min (ref >60)
GFR calc non Af Amer: >60 mL/min (ref >60)
Glucose, Bld: 137 mg/dL — ABNORMAL HIGH (ref 70–99)
Potassium: 4.3 mmol/L (ref 3.5–5.1)
Sodium: 148 mmol/L — ABNORMAL HIGH (ref 135–145)

## 2019-07-02 LAB — CBC
HCT: 24.4 % — ABNORMAL LOW (ref 36.0–46.0)
Hemoglobin: 7.5 g/dL — ABNORMAL LOW (ref 12.0–15.0)
MCH: 30 pg (ref 26.0–34.0)
MCHC: 30.7 g/dL (ref 30.0–36.0)
MCV: 97.6 fL (ref 80.0–100.0)
Platelets: 403 10*3/uL — ABNORMAL HIGH (ref 150–400)
RBC: 2.5 MIL/uL — ABNORMAL LOW (ref 3.87–5.11)
RDW: 15.4 % (ref 11.5–15.5)
WBC: 15.3 10*3/uL — ABNORMAL HIGH (ref 4.0–10.5)
nRBC: 0 % (ref 0.0–0.2)

## 2019-07-02 LAB — MAGNESIUM: Magnesium: 2 mg/dL (ref 1.7–2.4)

## 2019-07-02 NOTE — Progress Notes (Signed)
Pulmonary Critical Care Medicine [facility] [place]   PULMONARY CRITICAL CARE SERVICE  PROGRESS NOTE  Date of Service: 07/02/2019  RAYAH FINES  VEL:[id number]  DOB: 12-Dec-1965   DOA: 06/01/2019  Referring Physician: Carron Curie, MD  HPI: SHAKIERA EDELSON is a 54 y.o. female seen for follow up of Acute on Chronic Respiratory Failure.  Patient currently is on T collar has been on 28% FiO2 tolerating it well  Medications: Reviewed on Rounds  Physical Exam:  Vitals: Temperature 99.5 pulse 88 respiratory rate 17 blood pressure is 163/97 saturations 100%  Ventilator Settings mode ventilation is off the ventilator on T collar with an FiO2 28%  . General: Comfortable at this time . Eyes: Grossly normal lids, irises & conjunctiva . ENT: grossly tongue is normal . Neck: no obvious mass . Cardiovascular: S1 S2 normal no gallop . Respiratory: No rhonchi no rales are noted at this time . Abdomen: soft . Skin: no rash seen on limited exam . Musculoskeletal: not rigid . Psychiatric:unable to assess . Neurologic: no seizure no involuntary movements         Lab Data:   Basic Metabolic Panel: Recent Labs  Lab 06/26/19 0534 06/27/19 0723 06/29/19 1018 07/01/19 0703 07/02/19 0624  NA 147* 146* 149* 151* 148*  K 3.0* 4.4 4.0 3.9 4.3  CL 105 103 107 108 106  CO2 31 33* 32 34* 31  GLUCOSE 102* 101* 122* 110* 137*  BUN 99* 98* 89* 85* 85*  CREATININE 0.89 0.79 0.94 0.95 0.99  CALCIUM 8.8* 8.5* 8.7* 8.8* 8.7*  MG  --   --  1.9  --  2.0    ABG: No results for input(s): PHART, PCO2ART, PO2ART, HCO3, O2SAT in the last 168 hours.  Liver Function Tests: No results for input(s): AST, ALT, ALKPHOS, BILITOT, PROT, ALBUMIN in the last 168 hours. No results for input(s): LIPASE, AMYLASE in the last 168 hours. No results for input(s): AMMONIA in the last 168 hours.  CBC: Recent Labs  Lab 06/25/19 1600 06/29/19 1018 07/01/19 0703 07/02/19 0624  WBC 10.2 12.7* 12.6*  15.3*  NEUTROABS 5.8  --   --   --   HGB 7.0* 7.2* 7.2* 7.5*  HCT 22.8* 23.5* 24.0* 24.4*  MCV 96.6 97.5 98.4 97.6  PLT 434* 529* 497* 403*    Cardiac Enzymes: No results for input(s): CKTOTAL, CKMB, CKMBINDEX, TROPONINI in the last 168 hours.  BNP (last 3 results) Recent Labs    07/06/18 0628  BNP 1,109.4*    ProBNP (last 3 results) No results for input(s): PROBNP in the last 8760 hours.  Radiological Exams: No results found.  Assessment/Plan Active Problems:   Acute on chronic respiratory failure with hypoxia (HCC)   Acute tubular necrosis (HCC)   Chronic atrial fibrillation with rapid ventricular response (HCC)   Severe sepsis (HCC)   Healthcare-associated pneumonia   Chronic systolic CHF (congestive heart failure), NYHA class 3 (HCC)   1. Acute on chronic respiratory failure with hypoxia plan is to continue with T collar trials continue secretion management pulmonary toilet. 2. Acute tubular necrosis improved we will continue to follow labs 3. Severe sepsis resolved 4. Chronic atrial fibrillation rate controlled 5. Healthcare associated pneumonia treated follow radiologically 6. Chronic systolic heart failure diuretics as tolerated right now appears compensated   I have personally seen and evaluated the patient, evaluated laboratory and imaging results, formulated the assessment and plan and placed orders. The Patient requires high complexity decision making with multiple  systems involvement.  Rounds were done with the Respiratory Therapy Director and Staff therapists and discussed with nursing staff also.  Allyne Gee, MD [facility] Pulmonary Critical Care Medicine Sleep Medicine

## 2019-07-03 ENCOUNTER — Other Ambulatory Visit (HOSPITAL_COMMUNITY): Payer: Medicare Other

## 2019-07-03 DIAGNOSIS — N170 Acute kidney failure with tubular necrosis: Secondary | ICD-10-CM | POA: Diagnosis not present

## 2019-07-03 DIAGNOSIS — A419 Sepsis, unspecified organism: Secondary | ICD-10-CM | POA: Diagnosis not present

## 2019-07-03 DIAGNOSIS — J9621 Acute and chronic respiratory failure with hypoxia: Secondary | ICD-10-CM | POA: Diagnosis not present

## 2019-07-03 DIAGNOSIS — J189 Pneumonia, unspecified organism: Secondary | ICD-10-CM | POA: Diagnosis not present

## 2019-07-03 NOTE — Progress Notes (Signed)
Pulmonary Critical Care Medicine [place]   PULMONARY CRITICAL CARE SERVICE  PROGRESS NOTE  Date of Service: 07/03/2019  HILIANA EILTS  HYI:[id number]  DOB: [birth year]   DOA: 06/01/2019  Referring Physician: Merton Border, MD  HPI: Cristina Norris is a 54 y.o. female seen for follow up of Acute on Chronic Respiratory Failure.  Patient has been liberated from the ventilator right now is on T collar off the ventilator for about 24 hours  Medications: Reviewed on Rounds  Physical Exam:  Vitals: Temperature 97.5 pulse 87 respiratory 18 blood pressure is 142/86 saturations 100%  Ventilator Settings off the ventilator on T collar with an FiO2 of 28%  . General: Comfortable at this time . Eyes: Grossly normal lids, irises & conjunctiva . ENT: grossly tongue is normal . Neck: no obvious mass . Cardiovascular: S1 S2 normal no gallop . Respiratory: No rhonchi no rales are noted at this time . Abdomen: soft . Skin: no rash seen on limited exam . Musculoskeletal: not rigid . Psychiatric:unable to assess . Neurologic: no seizure no involuntary movements         Lab Data:   Basic Metabolic Panel: Recent Labs  Lab 06/27/19 0723 06/29/19 1018 07/01/19 0703 07/02/19 0624  NA 146* 149* 151* 148*  K 4.4 4.0 3.9 4.3  CL 103 107 108 106  CO2 33* 32 34* 31  GLUCOSE 101* 122* 110* 137*  BUN 98* 89* 85* 85*  CREATININE 0.79 0.94 0.95 0.99  CALCIUM 8.5* 8.7* 8.8* 8.7*  MG  --  1.9  --  2.0    ABG: No results for input(s): PHART, PCO2ART, PO2ART, HCO3, O2SAT in the last 168 hours.  Liver Function Tests: No results for input(s): AST, ALT, ALKPHOS, BILITOT, PROT, ALBUMIN in the last 168 hours. No results for input(s): LIPASE, AMYLASE in the last 168 hours. No results for input(s): AMMONIA in the last 168 hours.  CBC: Recent Labs  Lab 06/29/19 1018 07/01/19 0703 07/02/19 0624  WBC 12.7* 12.6* 15.3*  HGB 7.2* 7.2* 7.5*  HCT 23.5* 24.0* 24.4*  MCV  97.5 98.4 97.6  PLT 529* 497* 403*    Cardiac Enzymes: No results for input(s): CKTOTAL, CKMB, CKMBINDEX, TROPONINI in the last 168 hours.  BNP (last 3 results) Recent Labs    07/06/18 0628  BNP 1,109.4*    ProBNP (last 3 results) No results for input(s): PROBNP in the last 8760 hours.  Radiological Exams: DG Chest Port 1 View  Result Date: 07/03/2019 CLINICAL DATA:  Respiratory failure, tracheostomy. EXAM: PORTABLE CHEST 1 VIEW COMPARISON:  06/24/2019 and CT abdomen 06/23/2019. FINDINGS: Tracheostomy is midline. Nasogastric tube has been removed. Heart is at the upper limits of normal in size to mildly enlarged, stable. Mild mixed interstitial and airspace opacification, basilar dependent and slightly increased from 06/24/2019. Folds of adipose tissue overlie the lower left hemithorax, simulating retrocardiac airspace opacification. Probable small bilateral pleural effusions. IMPRESSION: Bibasilar dependent mixed interstitial and airspace opacification with small bilateral pleural effusions, findings which can be seen with congestive heart failure. Pneumonia is not excluded. Electronically Signed   By: Lorin Picket M.D.   On: 07/03/2019 07:55    Assessment/Plan Active Problems:   Acute on chronic respiratory failure with hypoxia (HCC)   Acute tubular necrosis (HCC)   Chronic atrial fibrillation with rapid ventricular response (HCC)   Severe sepsis (HCC)   Healthcare-associated pneumonia   Chronic systolic CHF (congestive heart failure), NYHA class 3 ([place])   1. Acute  on chronic respiratory failure with hypoxia continue with the wean protocol patient is liberated from the ventilator 2. ATN treated improved 3. Chronic atrial fibrillation rate is controlled we will continue with supportive care 4. Healthcare associated pneumonia chest x-ray still with some residual deficits 5. Severe sepsis resolved 6. Chronic systolic heart failure monitor fluid status   I have personally  seen and evaluated the patient, evaluated laboratory and imaging results, formulated the assessment and plan and placed orders. The Patient requires high complexity decision making with multiple systems involvement.  Rounds were done with the Respiratory Therapy Director and Staff therapists and discussed with nursing staff also.  Yevonne Pax, MD [facility] Pulmonary Critical Care Medicine Sleep Medicine

## 2019-07-04 DIAGNOSIS — A419 Sepsis, unspecified organism: Secondary | ICD-10-CM | POA: Diagnosis not present

## 2019-07-04 DIAGNOSIS — J9621 Acute and chronic respiratory failure with hypoxia: Secondary | ICD-10-CM | POA: Diagnosis not present

## 2019-07-04 DIAGNOSIS — N170 Acute kidney failure with tubular necrosis: Secondary | ICD-10-CM | POA: Diagnosis not present

## 2019-07-04 DIAGNOSIS — J189 Pneumonia, unspecified organism: Secondary | ICD-10-CM | POA: Diagnosis not present

## 2019-07-04 NOTE — Progress Notes (Signed)
Pulmonary Critical Care Medicine [facility] [place]   PULMONARY CRITICAL CARE SERVICE  PROGRESS NOTE  Date of Service: 07/04/2019  Cristina Norris  PXT:[id number]  DOB: 08-02-1965   DOA: 06/01/2019  Referring Physician: Carron Curie, MD  HPI: Cristina Norris is a 54 y.o. female seen for follow up of Acute on Chronic Respiratory Failure.  Patient is on T collar currently on 28% FiO2 has been off the ventilator now for 48 hours  Medications: Reviewed on Rounds  Physical Exam:  Vitals: Temperature 96.2 pulse 73 respiratory rate 11 blood pressure 142/82 saturations 100%  Ventilator Settings off the ventilator on T collar FiO2 28%  . General: Comfortable at this time . Eyes: Grossly normal lids, irises & conjunctiva . ENT: grossly tongue is normal . Neck: no obvious mass . Cardiovascular: S1 S2 normal no gallop . Respiratory: No rhonchi no rales are noted at this time . Abdomen: soft . Skin: no rash seen on limited exam . Musculoskeletal: not rigid . Psychiatric:unable to assess . Neurologic: no seizure no involuntary movements         Lab Data:   Basic Metabolic Panel: Recent Labs  Lab 06/29/19 1018 07/01/19 0703 07/02/19 0624  NA 149* 151* 148*  K 4.0 3.9 4.3  CL 107 108 106  CO2 32 34* 31  GLUCOSE 122* 110* 137*  BUN 89* 85* 85*  CREATININE 0.94 0.95 0.99  CALCIUM 8.7* 8.8* 8.7*  MG 1.9  --  2.0    ABG: No results for input(s): PHART, PCO2ART, PO2ART, HCO3, O2SAT in the last 168 hours.  Liver Function Tests: No results for input(s): AST, ALT, ALKPHOS, BILITOT, PROT, ALBUMIN in the last 168 hours. No results for input(s): LIPASE, AMYLASE in the last 168 hours. No results for input(s): AMMONIA in the last 168 hours.  CBC: Recent Labs  Lab 06/29/19 1018 07/01/19 0703 07/02/19 0624  WBC 12.7* 12.6* 15.3*  HGB 7.2* 7.2* 7.5*  HCT 23.5* 24.0* 24.4*  MCV 97.5 98.4 97.6  PLT 529* 497* 403*    Cardiac Enzymes: No results for input(s):  CKTOTAL, CKMB, CKMBINDEX, TROPONINI in the last 168 hours.  BNP (last 3 results) Recent Labs    07/06/18 0628  BNP 1,109.4*    ProBNP (last 3 results) No results for input(s): PROBNP in the last 8760 hours.  Radiological Exams: DG Chest Port 1 View  Result Date: 07/03/2019 CLINICAL DATA:  Respiratory failure, tracheostomy. EXAM: PORTABLE CHEST 1 VIEW COMPARISON:  06/24/2019 and CT abdomen 06/23/2019. FINDINGS: Tracheostomy is midline. Nasogastric tube has been removed. Heart is at the upper limits of normal in size to mildly enlarged, stable. Mild mixed interstitial and airspace opacification, basilar dependent and slightly increased from 06/24/2019. Folds of adipose tissue overlie the lower left hemithorax, simulating retrocardiac airspace opacification. Probable small bilateral pleural effusions. IMPRESSION: Bibasilar dependent mixed interstitial and airspace opacification with small bilateral pleural effusions, findings which can be seen with congestive heart failure. Pneumonia is not excluded. Electronically Signed   By: Leanna Battles M.D.   On: 07/03/2019 07:55    Assessment/Plan Active Problems:   Acute on chronic respiratory failure with hypoxia (HCC)   Acute tubular necrosis (HCC)   Chronic atrial fibrillation with rapid ventricular response (HCC)   Severe sepsis (HCC)   Healthcare-associated pneumonia   Chronic systolic CHF (congestive heart failure), NYHA class 3 (HCC)   1. Acute on chronic respiratory failure hypoxia plan is to continue with T collar trials as ordered continue aggressive pulmonary  toilet 2. Acute tubular necrosis no change 3. Chronic atrial fibrillation rate controlled 4. Severe sepsis resolved 5. Healthcare associated pneumonia treated 6. Chronic systolic heart failure right now is compensated   I have personally seen and evaluated the patient, evaluated laboratory and imaging results, formulated the assessment and plan and placed orders. The  Patient requires high complexity decision making with multiple systems involvement.  Rounds were done with the Respiratory Therapy Director and Staff therapists and discussed with nursing staff also.  Allyne Gee, MD [facility] Pulmonary Critical Care Medicine Sleep Medicine

## 2019-07-05 DIAGNOSIS — N170 Acute kidney failure with tubular necrosis: Secondary | ICD-10-CM | POA: Diagnosis not present

## 2019-07-05 DIAGNOSIS — A419 Sepsis, unspecified organism: Secondary | ICD-10-CM | POA: Diagnosis not present

## 2019-07-05 DIAGNOSIS — J9621 Acute and chronic respiratory failure with hypoxia: Secondary | ICD-10-CM | POA: Diagnosis not present

## 2019-07-05 DIAGNOSIS — J189 Pneumonia, unspecified organism: Secondary | ICD-10-CM | POA: Diagnosis not present

## 2019-07-05 LAB — BASIC METABOLIC PANEL
Anion gap: 6 (ref 5–15)
BUN: 65 mg/dL — ABNORMAL HIGH (ref 6–20)
CO2: 31 mmol/L (ref 22–32)
Calcium: 8.4 mg/dL — ABNORMAL LOW (ref 8.9–10.3)
Chloride: 101 mmol/L (ref 98–111)
Creatinine, Ser: 1.04 mg/dL — ABNORMAL HIGH (ref 0.44–1.00)
GFR calc Af Amer: >60 mL/min (ref >60)
GFR calc non Af Amer: >60 mL/min (ref >60)
Glucose, Bld: 97 mg/dL (ref 70–99)
Potassium: 3.5 mmol/L (ref 3.5–5.1)
Sodium: 138 mmol/L (ref 135–145)

## 2019-07-05 LAB — CULTURE, RESPIRATORY W GRAM STAIN

## 2019-07-05 NOTE — Progress Notes (Signed)
Pulmonary Critical Care Medicine [facility] [place]   PULMONARY CRITICAL CARE SERVICE  PROGRESS NOTE  Date of Service: 07/05/2019  Cristina Norris  NAT:[id number]  DOB: 08/10/1965   DOA: 06/01/2019  Referring Physician: Carron Curie, MD  HPI: Cristina Norris is a 54 y.o. female seen for follow up of Acute on Chronic Respiratory Failure.  Patient is resting comfortably she is back on her baseline T collar no distress  Medications: Reviewed on Rounds  Physical Exam:  Vitals: Temperature 97.5 pulse 109 respiratory 15 blood pressure is 109/68 saturations 100%  Ventilator Settings on T collar FiO2 28%  . General: Comfortable at this time . Eyes: Grossly normal lids, irises & conjunctiva . ENT: grossly tongue is normal . Neck: no obvious mass . Cardiovascular: S1 S2 normal no gallop . Respiratory: No rhonchi no rales are noted at this time . Abdomen: soft . Skin: no rash seen on limited exam . Musculoskeletal: not rigid . Psychiatric:unable to assess . Neurologic: no seizure no involuntary movements         Lab Data:   Basic Metabolic Panel: Recent Labs  Lab 06/29/19 1018 07/01/19 0703 07/02/19 0624 07/05/19 0438  NA 149* 151* 148* 138  K 4.0 3.9 4.3 3.5  CL 107 108 106 101  CO2 32 34* 31 31  GLUCOSE 122* 110* 137* 97  BUN 89* 85* 85* 65*  CREATININE 0.94 0.95 0.99 1.04*  CALCIUM 8.7* 8.8* 8.7* 8.4*  MG 1.9  --  2.0  --     ABG: No results for input(s): PHART, PCO2ART, PO2ART, HCO3, O2SAT in the last 168 hours.  Liver Function Tests: No results for input(s): AST, ALT, ALKPHOS, BILITOT, PROT, ALBUMIN in the last 168 hours. No results for input(s): LIPASE, AMYLASE in the last 168 hours. No results for input(s): AMMONIA in the last 168 hours.  CBC: Recent Labs  Lab 06/29/19 1018 07/01/19 0703 07/02/19 0624  WBC 12.7* 12.6* 15.3*  HGB 7.2* 7.2* 7.5*  HCT 23.5* 24.0* 24.4*  MCV 97.5 98.4 97.6  PLT 529* 497* 403*    Cardiac Enzymes: No  results for input(s): CKTOTAL, CKMB, CKMBINDEX, TROPONINI in the last 168 hours.  BNP (last 3 results) Recent Labs    07/06/18 0628  BNP 1,109.4*    ProBNP (last 3 results) No results for input(s): PROBNP in the last 8760 hours.  Radiological Exams: No results found.  Assessment/Plan Active Problems:   Acute on chronic respiratory failure with hypoxia (HCC)   Acute tubular necrosis (HCC)   Chronic atrial fibrillation with rapid ventricular response (HCC)   Severe sepsis (HCC)   Healthcare-associated pneumonia   Chronic systolic CHF (congestive heart failure), NYHA class 3 (HCC)   1. Acute on chronic respiratory failure with hypoxia patient continues on T collar trials right now is on 28% FiO2 2. Acute tubular necrosis no changes right now we will continue to follow the labs 3. Chronic atrial fibrillation rate controlled 4. Severe sepsis resolved 5. Healthcare associated pneumonia treated 6. Chronic systolic heart failure compensated we will continue to monitor fluid status   I have personally seen and evaluated the patient, evaluated laboratory and imaging results, formulated the assessment and plan and placed orders. The Patient requires high complexity decision making with multiple systems involvement.  Rounds were done with the Respiratory Therapy Director and Staff therapists and discussed with nursing staff also.  Yevonne Pax, MD [facility] Pulmonary Critical Care Medicine Sleep Medicine

## 2019-07-06 DIAGNOSIS — J9621 Acute and chronic respiratory failure with hypoxia: Secondary | ICD-10-CM | POA: Diagnosis not present

## 2019-07-06 DIAGNOSIS — J189 Pneumonia, unspecified organism: Secondary | ICD-10-CM | POA: Diagnosis not present

## 2019-07-06 DIAGNOSIS — N170 Acute kidney failure with tubular necrosis: Secondary | ICD-10-CM | POA: Diagnosis not present

## 2019-07-06 DIAGNOSIS — A419 Sepsis, unspecified organism: Secondary | ICD-10-CM | POA: Diagnosis not present

## 2019-07-06 NOTE — Progress Notes (Signed)
Pulmonary Critical Care Medicine [facility] [place]   PULMONARY CRITICAL CARE SERVICE  PROGRESS NOTE  Date of Service: 07/06/2019  Cristina Norris  YHC:[id number]  DOB: April 18, 1966   DOA: 06/01/2019  Referring Physician: Carron Curie, MD  HPI: Cristina Norris is a 54 y.o. female seen for follow up of Acute on Chronic Respiratory Failure.  Patient is on T collar on 28% FiO2 she is doing well  Medications: Reviewed on Rounds  Physical Exam:  Vitals: Temperature 98.9 pulse 87 respiratory 16 blood pressure is 138/88 saturations 100%  Ventilator Settings off the ventilator on T collar FiO2 28%  . General: Comfortable at this time . Eyes: Grossly normal lids, irises & conjunctiva . ENT: grossly tongue is normal . Neck: no obvious mass . Cardiovascular: S1 S2 normal no gallop . Respiratory: No rhonchi no rales are noted at this time . Abdomen: soft . Skin: no rash seen on limited exam . Musculoskeletal: not rigid . Psychiatric:unable to assess . Neurologic: no seizure no involuntary movements         Lab Data:   Basic Metabolic Panel: Recent Labs  Lab 07/01/19 0703 07/02/19 0624 07/05/19 0438  NA 151* 148* 138  K 3.9 4.3 3.5  CL 108 106 101  CO2 34* 31 31  GLUCOSE 110* 137* 97  BUN 85* 85* 65*  CREATININE 0.95 0.99 1.04*  CALCIUM 8.8* 8.7* 8.4*  MG  --  2.0  --     ABG: No results for input(s): PHART, PCO2ART, PO2ART, HCO3, O2SAT in the last 168 hours.  Liver Function Tests: No results for input(s): AST, ALT, ALKPHOS, BILITOT, PROT, ALBUMIN in the last 168 hours. No results for input(s): LIPASE, AMYLASE in the last 168 hours. No results for input(s): AMMONIA in the last 168 hours.  CBC: Recent Labs  Lab 07/01/19 0703 07/02/19 0624  WBC 12.6* 15.3*  HGB 7.2* 7.5*  HCT 24.0* 24.4*  MCV 98.4 97.6  PLT 497* 403*    Cardiac Enzymes: No results for input(s): CKTOTAL, CKMB, CKMBINDEX, TROPONINI in the last 168 hours.  BNP (last 3  results) No results for input(s): BNP in the last 8760 hours.  ProBNP (last 3 results) No results for input(s): PROBNP in the last 8760 hours.  Radiological Exams: No results found.  Assessment/Plan Active Problems:   Acute on chronic respiratory failure with hypoxia (HCC)   Acute tubular necrosis (HCC)   Chronic atrial fibrillation with rapid ventricular response (HCC)   Severe sepsis (HCC)   Healthcare-associated pneumonia   Chronic systolic CHF (congestive heart failure), NYHA class 3 (HCC)   1. Acute on chronic respiratory failure hypoxia plan is to continue T collar trials titrate oxygen continue pulmonary toilet 2. Acute tubular necrosis improving we will continue to follow labs 3. Chronic atrial fibrillation rate controlled 4. Severe sepsis hemodynamically stable 5. Healthcare associated pneumonia treated 6. Chronic systolic heart failure compensated   I have personally seen and evaluated the patient, evaluated laboratory and imaging results, formulated the assessment and plan and placed orders. The Patient requires high complexity decision making with multiple systems involvement.  Rounds were done with the Respiratory Therapy Director and Staff therapists and discussed with nursing staff also.  Yevonne Pax, MD [facility] Pulmonary Critical Care Medicine Sleep Medicine

## 2019-07-07 DIAGNOSIS — J9621 Acute and chronic respiratory failure with hypoxia: Secondary | ICD-10-CM | POA: Diagnosis not present

## 2019-07-07 DIAGNOSIS — J189 Pneumonia, unspecified organism: Secondary | ICD-10-CM | POA: Diagnosis not present

## 2019-07-07 DIAGNOSIS — N170 Acute kidney failure with tubular necrosis: Secondary | ICD-10-CM | POA: Diagnosis not present

## 2019-07-07 DIAGNOSIS — A419 Sepsis, unspecified organism: Secondary | ICD-10-CM | POA: Diagnosis not present

## 2019-07-07 NOTE — Progress Notes (Signed)
Pulmonary Critical Care Medicine [facility] [place]   PULMONARY CRITICAL CARE SERVICE  PROGRESS NOTE  Date of Service: 07/07/2019  [name]  KDX:[id number]  DOB: 05/17/1966   DOA: 06/01/2019  Referring Physician: Carron Curie, MD  HPI: Cristina Norris is a 54 y.o. female seen for follow up of Acute on Chronic Respiratory Failure.  Patient currently is on T collar good saturations are noted  Medications: Reviewed on Rounds  Physical Exam:  Vitals: Temperature 98.6 pulse 95 respiratory rate 19 blood pressure is 148/71 saturations 99%  Ventilator Settings off the ventilator on T collar at baseline  . General: Comfortable at this time . Eyes: Grossly normal lids, irises & conjunctiva . ENT: grossly tongue is normal . Neck: no obvious mass . Cardiovascular: S1 S2 normal no gallop . Respiratory: No rhonchi no rales are noted at this time . Abdomen: soft . Skin: no rash seen on limited exam . Musculoskeletal: not rigid . Psychiatric:unable to assess . Neurologic: no seizure no involuntary movements         Lab Data:   Basic Metabolic Panel: Recent Labs  Lab 07/01/19 0703 07/02/19 0624 07/05/19 0438  NA 151* 148* 138  K 3.9 4.3 3.5  CL 108 106 101  CO2 34* 31 31  GLUCOSE 110* 137* 97  BUN 85* 85* 65*  CREATININE 0.95 0.99 1.04*  CALCIUM 8.8* 8.7* 8.4*  MG  --  2.0  --     ABG: No results for input(s): PHART, PCO2ART, PO2ART, HCO3, O2SAT in the last 168 hours.  Liver Function Tests: No results for input(s): AST, ALT, ALKPHOS, BILITOT, PROT, ALBUMIN in the last 168 hours. No results for input(s): LIPASE, AMYLASE in the last 168 hours. No results for input(s): AMMONIA in the last 168 hours.  CBC: Recent Labs  Lab 07/01/19 0703 07/02/19 0624  WBC 12.6* 15.3*  HGB 7.2* 7.5*  HCT 24.0* 24.4*  MCV 98.4 97.6  PLT 497* 403*    Cardiac Enzymes: No results for input(s): CKTOTAL, CKMB, CKMBINDEX, TROPONINI in the last 168 hours.  BNP (last  3 results) No results for input(s): BNP in the last 8760 hours.  ProBNP (last 3 results) No results for input(s): PROBNP in the last 8760 hours.  Radiological Exams: No results found.  Assessment/Plan Active Problems:   Acute on chronic respiratory failure with hypoxia (HCC)   Acute tubular necrosis (HCC)   Chronic atrial fibrillation with rapid ventricular response (HCC)   Severe sepsis (HCC)   Healthcare-associated pneumonia   Chronic systolic CHF (congestive heart failure), NYHA class 3 (HCC)   1. Acute on chronic respiratory failure with hypoxia plan continue with the T collar trials patient is on 28% FiO2 continue secretion management supportive care. 2. Acute tubular necrosis no changes we will continue to follow 3. Chronic atrial fibrillation rate is controlled 4. Severe sepsis hemodynamics are stable 5. Healthcare associated pneumonia treated 6. Chronic systolic heart failure compensated we will continue to   I have personally seen and evaluated the patient, evaluated laboratory and imaging results, formulated the assessment and plan and placed orders. The Patient requires high complexity decision making with multiple systems involvement.  Rounds were done with the Respiratory Therapy Director and Staff therapists and discussed with nursing staff also.  Yevonne Pax, MD [facility] Pulmonary Critical Care Medicine Sleep Medicine

## 2019-07-08 DIAGNOSIS — J189 Pneumonia, unspecified organism: Secondary | ICD-10-CM | POA: Diagnosis not present

## 2019-07-08 DIAGNOSIS — A419 Sepsis, unspecified organism: Secondary | ICD-10-CM | POA: Diagnosis not present

## 2019-07-08 DIAGNOSIS — J9621 Acute and chronic respiratory failure with hypoxia: Secondary | ICD-10-CM | POA: Diagnosis not present

## 2019-07-08 DIAGNOSIS — N170 Acute kidney failure with tubular necrosis: Secondary | ICD-10-CM | POA: Diagnosis not present

## 2019-07-08 NOTE — Progress Notes (Signed)
Pulmonary Critical Care Medicine [facility] [place]   PULMONARY CRITICAL CARE SERVICE  PROGRESS NOTE  Date of Service: 07/08/2019  KORTNE ALL  [id number]  DOB: 1966-03-20   DOA: 06/01/2019  Referring Physician: Carron Curie, MD  HPI: Cristina Norris is a 54 y.o. female seen for follow up of Acute on Chronic Respiratory Failure.  Patient is on T collar on 20% FiO2 using PMV good saturations  Medications: Reviewed on Rounds  Physical Exam:  Vitals: Temperature 99.3 pulse 84 respiratory rate 14 blood pressure is 141/78 saturations 98%  Ventilator Settings on T collar FiO2 20%  . General: Comfortable at this time . Eyes: Grossly normal lids, irises & conjunctiva . ENT: grossly tongue is normal . Neck: no obvious mass . Cardiovascular: S1 S2 normal no gallop . Respiratory: No rhonchi coarse breath sounds . Abdomen: soft . Skin: no rash seen on limited exam . Musculoskeletal: not rigid . Psychiatric:unable to assess . Neurologic: no seizure no involuntary movements         Lab Data:   Basic Metabolic Panel: Recent Labs  Lab 07/02/19 0624 07/05/19 0438  NA 148* 138  K 4.3 3.5  CL 106 101  CO2 31 31  GLUCOSE 137* 97  BUN 85* 65*  CREATININE 0.99 1.04*  CALCIUM 8.7* 8.4*  MG 2.0  --     ABG: No results for input(s): PHART, PCO2ART, PO2ART, HCO3, O2SAT in the last 168 hours.  Liver Function Tests: No results for input(s): AST, ALT, ALKPHOS, BILITOT, PROT, ALBUMIN in the last 168 hours. No results for input(s): LIPASE, AMYLASE in the last 168 hours. No results for input(s): AMMONIA in the last 168 hours.  CBC: Recent Labs  Lab 07/02/19 0624  WBC 15.3*  HGB 7.5*  HCT 24.4*  MCV 97.6  PLT 403*    Cardiac Enzymes: No results for input(s): CKTOTAL, CKMB, CKMBINDEX, TROPONINI in the last 168 hours.  BNP (last 3 results) No results for input(s): BNP in the last 8760 hours.  ProBNP (last 3 results) No results for input(s): PROBNP in  the last 8760 hours.  Radiological Exams: No results found.  Assessment/Plan Active Problems:   Acute on chronic respiratory failure with hypoxia (HCC)   Acute tubular necrosis (HCC)   Chronic atrial fibrillation with rapid ventricular response (HCC)   Severe sepsis (HCC)   Healthcare-associated pneumonia   Chronic systolic CHF (congestive heart failure), NYHA class 3 (HCC)   1. Acute on chronic respiratory failure hypoxia plan is to continue with T collar trials on 28% FiO2 with PMV 2. ATN resolved 3. Chronic atrial fibrillation rate controlled 4. Severe sepsis hemodynamics are stable 5. Healthcare associated nitrate improved 6. Chronic systolic heart failure compensated monitor fluid status   I have personally seen and evaluated the patient, evaluated laboratory and imaging results, formulated the assessment and plan and placed orders. The Patient requires high complexity decision making with multiple systems involvement.  Rounds were done with the Respiratory Therapy Director and Staff therapists and discussed with nursing staff also.  Yevonne Pax, MD [facility] Pulmonary Critical Care Medicine Sleep Medicine

## 2019-07-09 DIAGNOSIS — A419 Sepsis, unspecified organism: Secondary | ICD-10-CM | POA: Diagnosis not present

## 2019-07-09 DIAGNOSIS — N170 Acute kidney failure with tubular necrosis: Secondary | ICD-10-CM | POA: Diagnosis not present

## 2019-07-09 DIAGNOSIS — J189 Pneumonia, unspecified organism: Secondary | ICD-10-CM | POA: Diagnosis not present

## 2019-07-09 DIAGNOSIS — J9621 Acute and chronic respiratory failure with hypoxia: Secondary | ICD-10-CM | POA: Diagnosis not present

## 2019-07-09 NOTE — Progress Notes (Addendum)
Pulmonary Critical Care Medicine [facility] [place]   PULMONARY CRITICAL CARE SERVICE  PROGRESS NOTE  Date of Service: 07/09/2019  Cristina Norris  [id number]  DOB: September 13, 1965   DOA: 06/01/2019  Referring Physician: Carron Curie, MD  HPI: Cristina Norris is a 53 y.o. female seen for follow up of Acute on Chronic Respiratory Failure. Patient mains on aerosol trach collar 28% FiO2 no fever or distress.  Medications: Reviewed on Rounds  Physical Exam:  Vitals: Pulse 77 respirations 11 BP 109/66 O2 sat 100% temp 97.2  Ventilator Settings 28% ATC  . General: Comfortable at this time . Eyes: Grossly normal lids, irises & conjunctiva . ENT: grossly tongue is normal . Neck: no obvious mass . Cardiovascular: S1 S2 normal no gallop . Respiratory: No rales or rhonchi noted . Abdomen: soft . Skin: no rash seen on limited exam . Musculoskeletal: not rigid . Psychiatric:unable to assess . Neurologic: no seizure no involuntary movements         Lab Data:   Basic Metabolic Panel: Recent Labs  Lab 07/05/19 0438  NA 138  K 3.5  CL 101  CO2 31  GLUCOSE 97  BUN 65*  CREATININE 1.04*  CALCIUM 8.4*    ABG: No results for input(s): PHART, PCO2ART, PO2ART, HCO3, O2SAT in the last 168 hours.  Liver Function Tests: No results for input(s): AST, ALT, ALKPHOS, BILITOT, PROT, ALBUMIN in the last 168 hours. No results for input(s): LIPASE, AMYLASE in the last 168 hours. No results for input(s): AMMONIA in the last 168 hours.  CBC: No results for input(s): WBC, NEUTROABS, HGB, HCT, MCV, PLT in the last 168 hours.  Cardiac Enzymes: No results for input(s): CKTOTAL, CKMB, CKMBINDEX, TROPONINI in the last 168 hours.  BNP (last 3 results) No results for input(s): BNP in the last 8760 hours.  ProBNP (last 3 results) No results for input(s): PROBNP in the last 8760 hours.  Radiological Exams: No results found.  Assessment/Plan Active Problems:   Acute on  chronic respiratory failure with hypoxia (HCC)   Acute tubular necrosis (HCC)   Chronic atrial fibrillation with rapid ventricular response (HCC)   Severe sepsis (HCC)   Healthcare-associated pneumonia   Chronic systolic CHF (congestive heart failure), NYHA class 3 (HCC)   1. Acute on chronic respiratory failure hypoxia plan is to continue with T collar trials on 28% FiO2 with PMV 2. ATN resolved 3. Chronic atrial fibrillation rate controlled 4. Severe sepsis hemodynamics are stable 5. Healthcare associated nitrate improved 6. Chronic systolic heart failure compensated monitor fluid status   I have personally seen and evaluated the patient, evaluated laboratory and imaging results, formulated the assessment and plan and placed orders. The Patient requires high complexity decision making with multiple systems involvement.  Rounds were done with the Respiratory Therapy Director and Staff therapists and discussed with nursing staff also.  Yevonne Pax, MD [facility] Pulmonary Critical Care Medicine Sleep Medicine

## 2019-07-10 DIAGNOSIS — N170 Acute kidney failure with tubular necrosis: Secondary | ICD-10-CM | POA: Diagnosis not present

## 2019-07-10 DIAGNOSIS — A419 Sepsis, unspecified organism: Secondary | ICD-10-CM | POA: Diagnosis not present

## 2019-07-10 DIAGNOSIS — J189 Pneumonia, unspecified organism: Secondary | ICD-10-CM | POA: Diagnosis not present

## 2019-07-10 DIAGNOSIS — J9621 Acute and chronic respiratory failure with hypoxia: Secondary | ICD-10-CM | POA: Diagnosis not present

## 2019-07-10 LAB — SARS CORONAVIRUS 2 (TAT 6-24 HRS): SARS Coronavirus 2: NEGATIVE

## 2019-07-10 NOTE — Progress Notes (Signed)
Pulmonary Critical Care Medicine [facility] [place]   PULMONARY CRITICAL CARE SERVICE  PROGRESS NOTE  Date of Service: 07/10/2019  Cristina Norris  [id number]  DOB: 11-22-1965   DOA: 06/01/2019  Referring Physician: Carron Curie, MD  HPI: Cristina Norris is a 54 y.o. female seen for follow up of Acute on Chronic Respiratory Failure.  Patient is currently on T collar has been on 28% FiO2 with good saturations.  She is at her baseline.  Secretions are fair to moderate  Medications: Reviewed on Rounds  Physical Exam:  Vitals: Temperature 97.3 pulse 84 respiratory rate 17 blood pressure is 130/71 saturations 100%  Ventilator Settings off the ventilator on T collar currently FiO2 28%  . General: Comfortable at this time . Eyes: Grossly normal lids, irises & conjunctiva . ENT: grossly tongue is normal . Neck: no obvious mass . Cardiovascular: S1 S2 normal no gallop . Respiratory: No rhonchi no rales are noted at this time . Abdomen: soft . Skin: no rash seen on limited exam . Musculoskeletal: not rigid . Psychiatric:unable to assess . Neurologic: no seizure no involuntary movements         Lab Data:   Basic Metabolic Panel: Recent Labs  Lab 07/05/19 0438  NA 138  K 3.5  CL 101  CO2 31  GLUCOSE 97  BUN 65*  CREATININE 1.04*  CALCIUM 8.4*    ABG: No results for input(s): PHART, PCO2ART, PO2ART, HCO3, O2SAT in the last 168 hours.  Liver Function Tests: No results for input(s): AST, ALT, ALKPHOS, BILITOT, PROT, ALBUMIN in the last 168 hours. No results for input(s): LIPASE, AMYLASE in the last 168 hours. No results for input(s): AMMONIA in the last 168 hours.  CBC: No results for input(s): WBC, NEUTROABS, HGB, HCT, MCV, PLT in the last 168 hours.  Cardiac Enzymes: No results for input(s): CKTOTAL, CKMB, CKMBINDEX, TROPONINI in the last 168 hours.  BNP (last 3 results) No results for input(s): BNP in the last 8760 hours.  ProBNP (last 3  results) No results for input(s): PROBNP in the last 8760 hours.  Radiological Exams: No results found.  Assessment/Plan Active Problems:   Acute on chronic respiratory failure with hypoxia (HCC)   Acute tubular necrosis (HCC)   Chronic atrial fibrillation with rapid ventricular response (HCC)   Severe sepsis (HCC)   Healthcare-associated pneumonia   Chronic systolic CHF (congestive heart failure), NYHA class 3 (HCC)   1. Acute on chronic respiratory failure hypoxia continue with her baseline T collar secretions are fair to moderate continue aggressive pulmonary toilet. 2. Acute tubular necrosis resolved 3. Chronic atrial fibrillation rate controlled 4. Severe sepsis resolved 5. Healthcare associated pneumonia treated clinically improved 6. Chronic systolic heart failure need to monitor fluid status closely as she has had decompensation previously   I have personally seen and evaluated the patient, evaluated laboratory and imaging results, formulated the assessment and plan and placed orders. The Patient requires high complexity decision making with multiple systems involvement.  Rounds were done with the Respiratory Therapy Director and Staff therapists and discussed with nursing staff also.  Yevonne Pax, MD [facility] Pulmonary Critical Care Medicine Sleep Medicine

## 2019-07-11 DIAGNOSIS — A419 Sepsis, unspecified organism: Secondary | ICD-10-CM | POA: Diagnosis not present

## 2019-07-11 DIAGNOSIS — N170 Acute kidney failure with tubular necrosis: Secondary | ICD-10-CM | POA: Diagnosis not present

## 2019-07-11 DIAGNOSIS — J189 Pneumonia, unspecified organism: Secondary | ICD-10-CM | POA: Diagnosis not present

## 2019-07-11 DIAGNOSIS — J9621 Acute and chronic respiratory failure with hypoxia: Secondary | ICD-10-CM | POA: Diagnosis not present

## 2019-07-11 NOTE — Progress Notes (Signed)
Pulmonary Critical Care Medicine [facility] [place]   PULMONARY CRITICAL CARE SERVICE  PROGRESS NOTE  Date of Service: 07/11/2019  Cristina Norris  [id number]  DOB: 28-Jul-1965   DOA: 06/01/2019  Referring Physician: Carron Curie, MD  HPI: Cristina Norris is a 54 y.o. female seen for follow up of Acute on Chronic Respiratory Failure.  Patient currently is on T collar has been on 20% FiO2 with good saturations  Medications: Reviewed on Rounds  Physical Exam:  Vitals: Temperature 97.5 pulse 87 respiratory rate 23 blood pressure is 133/85 saturations 97%  Ventilator Settings off ventilator on T collar FiO2 28%  . General: Comfortable at this time . Eyes: Grossly normal lids, irises & conjunctiva . ENT: grossly tongue is normal . Neck: no obvious mass . Cardiovascular: S1 S2 normal no gallop . Respiratory: No rhonchi no rales are noted at this time . Abdomen: soft . Skin: no rash seen on limited exam . Musculoskeletal: not rigid . Psychiatric:unable to assess . Neurologic: no seizure no involuntary movements         Lab Data:   Basic Metabolic Panel: Recent Labs  Lab 07/05/19 0438  NA 138  K 3.5  CL 101  CO2 31  GLUCOSE 97  BUN 65*  CREATININE 1.04*  CALCIUM 8.4*    ABG: No results for input(s): PHART, PCO2ART, PO2ART, HCO3, O2SAT in the last 168 hours.  Liver Function Tests: No results for input(s): AST, ALT, ALKPHOS, BILITOT, PROT, ALBUMIN in the last 168 hours. No results for input(s): LIPASE, AMYLASE in the last 168 hours. No results for input(s): AMMONIA in the last 168 hours.  CBC: No results for input(s): WBC, NEUTROABS, HGB, HCT, MCV, PLT in the last 168 hours.  Cardiac Enzymes: No results for input(s): CKTOTAL, CKMB, CKMBINDEX, TROPONINI in the last 168 hours.  BNP (last 3 results) No results for input(s): BNP in the last 8760 hours.  ProBNP (last 3 results) No results for input(s): PROBNP in the last 8760  hours.  Radiological Exams: No results found.  Assessment/Plan Active Problems:   Acute on chronic respiratory failure with hypoxia (HCC)   Acute tubular necrosis (HCC)   Chronic atrial fibrillation with rapid ventricular response (HCC)   Severe sepsis (HCC)   Healthcare-associated pneumonia   Chronic systolic CHF (congestive heart failure), NYHA class 3 (HCC)   1. Acute on chronic respiratory failure hypoxia plan is to continue with T collar patient is at her baseline now continue secretion management pulmonary toilet 2. Acute tubular necrosis resolved we will continue with supportive care 3. Chronic atrial fibrillation rate controlled 4. Severe sepsis hemodynamics are stable 5. Healthcare associated pneumonia treated clinically improved 6. Chronic systolic heart failure baseline   I have personally seen and evaluated the patient, evaluated laboratory and imaging results, formulated the assessment and plan and placed orders. The Patient requires high complexity decision making with multiple systems involvement.  Rounds were done with the Respiratory Therapy Director and Staff therapists and discussed with nursing staff also.  Yevonne Pax, MD [facility] Pulmonary Critical Care Medicine Sleep Medicine

## 2019-07-12 DIAGNOSIS — N170 Acute kidney failure with tubular necrosis: Secondary | ICD-10-CM | POA: Diagnosis not present

## 2019-07-12 DIAGNOSIS — J9621 Acute and chronic respiratory failure with hypoxia: Secondary | ICD-10-CM | POA: Diagnosis not present

## 2019-07-12 DIAGNOSIS — A419 Sepsis, unspecified organism: Secondary | ICD-10-CM | POA: Diagnosis not present

## 2019-07-12 DIAGNOSIS — J189 Pneumonia, unspecified organism: Secondary | ICD-10-CM | POA: Diagnosis not present

## 2019-07-12 NOTE — Progress Notes (Signed)
Pulmonary Critical Care Medicine [facility] [place]   PULMONARY CRITICAL CARE SERVICE  PROGRESS NOTE  Date of Service: 07/12/2019  Cristina Norris  [id number]  DOB: 11-Feb-1966   DOA: 06/01/2019  Referring Physician: Carron Curie, MD  HPI: Cristina Norris is a 54 y.o. female seen for follow up of Acute on Chronic Respiratory Failure.  Patient currently is on T collar has been on 20% FiO2 using the PMV without any issues  Medications: Reviewed on Rounds  Physical Exam:  Vitals: Temperature 97.8 pulse 84 respiratory 20 blood pressure is 135/68 saturations 100%  Ventilator Settings on T collar FiO2 28% PMV is on  . General: Comfortable at this time . Eyes: Grossly normal lids, irises & conjunctiva . ENT: grossly tongue is normal . Neck: no obvious mass . Cardiovascular: S1 S2 normal no gallop . Respiratory: No rhonchi no rales are noted at this time . Abdomen: soft . Skin: no rash seen on limited exam . Musculoskeletal: not rigid . Psychiatric:unable to assess . Neurologic: no seizure no involuntary movements         Lab Data:   Basic Metabolic Panel: No results for input(s): NA, K, CL, CO2, GLUCOSE, BUN, CREATININE, CALCIUM, MG, PHOS in the last 168 hours.  ABG: No results for input(s): PHART, PCO2ART, PO2ART, HCO3, O2SAT in the last 168 hours.  Liver Function Tests: No results for input(s): AST, ALT, ALKPHOS, BILITOT, PROT, ALBUMIN in the last 168 hours. No results for input(s): LIPASE, AMYLASE in the last 168 hours. No results for input(s): AMMONIA in the last 168 hours.  CBC: No results for input(s): WBC, NEUTROABS, HGB, HCT, MCV, PLT in the last 168 hours.  Cardiac Enzymes: No results for input(s): CKTOTAL, CKMB, CKMBINDEX, TROPONINI in the last 168 hours.  BNP (last 3 results) No results for input(s): BNP in the last 8760 hours.  ProBNP (last 3 results) No results for input(s): PROBNP in the last 8760 hours.  Radiological Exams: No  results found.  Assessment/Plan Active Problems:   Acute on chronic respiratory failure with hypoxia (HCC)   Acute tubular necrosis (HCC)   Chronic atrial fibrillation with rapid ventricular response (HCC)   Severe sepsis (HCC)   Healthcare-associated pneumonia   Chronic systolic CHF (congestive heart failure), NYHA class 3 (HCC)   1. Acute on chronic respiratory failure hypoxia she is on T collar at baseline with the PMV.  Plan is to continue with secretion management supportive care 2. Acute on chronic renal failure with tubular necrosis resolved patient labs are being followed 3. Severe sepsis resolved 4. Chronic atrial fibrillation rate controlled 5. Healthcare associated pneumonia treated 6. Chronic systolic heart failure at baseline we will continue supportive care   I have personally seen and evaluated the patient, evaluated laboratory and imaging results, formulated the assessment and plan and placed orders. The Patient requires high complexity decision making with multiple systems involvement.  Rounds were done with the Respiratory Therapy Director and Staff therapists and discussed with nursing staff also.  Yevonne Pax, MD [facility] Pulmonary Critical Care Medicine Sleep Medicine

## 2019-07-13 DIAGNOSIS — J9621 Acute and chronic respiratory failure with hypoxia: Secondary | ICD-10-CM | POA: Diagnosis not present

## 2019-07-13 DIAGNOSIS — A419 Sepsis, unspecified organism: Secondary | ICD-10-CM | POA: Diagnosis not present

## 2019-07-13 DIAGNOSIS — N170 Acute kidney failure with tubular necrosis: Secondary | ICD-10-CM | POA: Diagnosis not present

## 2019-07-13 DIAGNOSIS — J189 Pneumonia, unspecified organism: Secondary | ICD-10-CM | POA: Diagnosis not present

## 2019-07-13 NOTE — Progress Notes (Signed)
Pulmonary Critical Care Medicine [facility] [place]   PULMONARY CRITICAL CARE SERVICE  PROGRESS NOTE  Date of Service: 07/13/2019  Cristina Norris  RDE:[id number]  DOB: 1966-05-07   DOA: 06/01/2019  Referring Physician: Carron Curie, MD  HPI: Cristina Norris is a 54 y.o. female seen for follow up of Acute on Chronic Respiratory Failure.  Patient at this time is on T collar has been on 28% FiO2 she is at her baseline  Medications: Reviewed on Rounds  Physical Exam:  Vitals: Temperature 98.3 pulse 84 respiratory 21 blood pressure is 131/75 saturations 100%  Ventilator Settings off the ventilator on T collar with an FiO2 28%  . General: Comfortable at this time . Eyes: Grossly normal lids, irises & conjunctiva . ENT: grossly tongue is normal . Neck: no obvious mass . Cardiovascular: S1 S2 normal no gallop . Respiratory: No rhonchi coarse breath sounds are noted . Abdomen: soft . Skin: no rash seen on limited exam . Musculoskeletal: not rigid . Psychiatric:unable to assess . Neurologic: no seizure no involuntary movements         Lab Data:   Basic Metabolic Panel: No results for input(s): NA, K, CL, CO2, GLUCOSE, BUN, CREATININE, CALCIUM, MG, PHOS in the last 168 hours.  ABG: No results for input(s): PHART, PCO2ART, PO2ART, HCO3, O2SAT in the last 168 hours.  Liver Function Tests: No results for input(s): AST, ALT, ALKPHOS, BILITOT, PROT, ALBUMIN in the last 168 hours. No results for input(s): LIPASE, AMYLASE in the last 168 hours. No results for input(s): AMMONIA in the last 168 hours.  CBC: No results for input(s): WBC, NEUTROABS, HGB, HCT, MCV, PLT in the last 168 hours.  Cardiac Enzymes: No results for input(s): CKTOTAL, CKMB, CKMBINDEX, TROPONINI in the last 168 hours.  BNP (last 3 results) No results for input(s): BNP in the last 8760 hours.  ProBNP (last 3 results) No results for input(s): PROBNP in the last 8760 hours.  Radiological  Exams: No results found.  Assessment/Plan Active Problems:   Acute on chronic respiratory failure with hypoxia (HCC)   Acute tubular necrosis (HCC)   Chronic atrial fibrillation with rapid ventricular response (HCC)   Severe sepsis (HCC)   Healthcare-associated pneumonia   Chronic systolic CHF (congestive heart failure), NYHA class 3 (HCC)   1. Acute on chronic respiratory failure hypoxia plan is to continue with T collar trials patient currently is on 28% FiO2 good saturations are noted plan is to continue aggressive pulmonary toilet this is her baseline 2. Acute tubular necrosis resolving 3. Chronic atrial fibrillation rate is controlled 4. Severe sepsis resolved 5. Healthcare associated pneumonia treated monitor radiologically 6. Chronic systolic heart failure right now appears to be compensated but she does have very limited reserve   I have personally seen and evaluated the patient, evaluated laboratory and imaging results, formulated the assessment and plan and placed orders. The Patient requires high complexity decision making with multiple systems involvement.  Rounds were done with the Respiratory Therapy Director and Staff therapists and discussed with nursing staff also.  Yevonne Pax, MD [facility] Pulmonary Critical Care Medicine Sleep Medicine

## 2019-07-14 DIAGNOSIS — J9621 Acute and chronic respiratory failure with hypoxia: Secondary | ICD-10-CM | POA: Diagnosis not present

## 2019-07-14 DIAGNOSIS — N170 Acute kidney failure with tubular necrosis: Secondary | ICD-10-CM | POA: Diagnosis not present

## 2019-07-14 DIAGNOSIS — J189 Pneumonia, unspecified organism: Secondary | ICD-10-CM | POA: Diagnosis not present

## 2019-07-14 DIAGNOSIS — A419 Sepsis, unspecified organism: Secondary | ICD-10-CM | POA: Diagnosis not present

## 2019-07-14 LAB — CBC
HCT: 21.9 % — ABNORMAL LOW (ref 36.0–46.0)
HCT: 23.1 % — ABNORMAL LOW (ref 36.0–46.0)
Hemoglobin: 6.6 g/dL — CL (ref 12.0–15.0)
Hemoglobin: 7.4 g/dL — ABNORMAL LOW (ref 12.0–15.0)
MCH: 29.2 pg (ref 26.0–34.0)
MCH: 30.1 pg (ref 26.0–34.0)
MCHC: 30.1 g/dL (ref 30.0–36.0)
MCHC: 32 g/dL (ref 30.0–36.0)
MCV: 96.9 fL (ref 80.0–100.0)
MCV: 93.9 fL (ref 80.0–100.0)
Platelets: 570 10*3/uL — ABNORMAL HIGH (ref 150–400)
Platelets: 508 10*3/uL — ABNORMAL HIGH (ref 150–400)
RBC: 2.26 MIL/uL — ABNORMAL LOW (ref 3.87–5.11)
RBC: 2.46 MIL/uL — ABNORMAL LOW (ref 3.87–5.11)
RDW: 15.1 % (ref 11.5–15.5)
RDW: 15 % (ref 11.5–15.5)
WBC: 14 10*3/uL — ABNORMAL HIGH (ref 4.0–10.5)
WBC: 13 10*3/uL — ABNORMAL HIGH (ref 4.0–10.5)
nRBC: 0.4 % — ABNORMAL HIGH (ref 0.0–0.2)
nRBC: 0.3 % — ABNORMAL HIGH (ref 0.0–0.2)

## 2019-07-14 LAB — BASIC METABOLIC PANEL
Anion gap: 12 (ref 5–15)
BUN: 51 mg/dL — ABNORMAL HIGH (ref 6–20)
CO2: 24 mmol/L (ref 22–32)
Calcium: 8.5 mg/dL — ABNORMAL LOW (ref 8.9–10.3)
Chloride: 102 mmol/L (ref 98–111)
Creatinine, Ser: 1.5 mg/dL — ABNORMAL HIGH (ref 0.44–1.00)
GFR calc Af Amer: 45 mL/min — ABNORMAL LOW (ref >60)
GFR calc non Af Amer: 39 mL/min — ABNORMAL LOW (ref >60)
Glucose, Bld: 127 mg/dL — ABNORMAL HIGH (ref 70–99)
Potassium: 5.9 mmol/L — ABNORMAL HIGH (ref 3.5–5.1)
Sodium: 138 mmol/L (ref 135–145)

## 2019-07-14 LAB — PREPARE RBC (CROSSMATCH)

## 2019-07-14 LAB — POTASSIUM: Potassium: 5.1 mmol/L (ref 3.5–5.1)

## 2019-07-14 LAB — SARS CORONAVIRUS 2 (TAT 6-24 HRS): SARS Coronavirus 2: NEGATIVE

## 2019-07-14 NOTE — Progress Notes (Signed)
Pulmonary Critical Care Medicine [facility] [place]   PULMONARY CRITICAL CARE SERVICE  PROGRESS NOTE  Date of Service: 07/14/2019  Cristina Norris  OFB:[id number]  DOB: Jul 17, 1965   DOA: 06/01/2019  Referring Physician: Carron Curie, MD  HPI: Cristina Norris is a 54 y.o. female seen for follow up of Acute on Chronic Respiratory Failure.  Patient at this time is on T collar on 28% FiO2 using PMV good saturations are noted.  Medications: Reviewed on Rounds  Physical Exam:  Vitals: Temperature is 98.5 pulse 95 respiratory 19 blood pressure is 140/85 saturations 96%  Ventilator Settings on T collar with an FiO2 of 28% using PMV  . General: Comfortable at this time . Eyes: Grossly normal lids, irises & conjunctiva . ENT: grossly tongue is normal . Neck: no obvious mass . Cardiovascular: S1 S2 normal no gallop . Respiratory: Coarse breath sounds with few scattered rhonchi . Abdomen: soft . Skin: no rash seen on limited exam . Musculoskeletal: not rigid . Psychiatric:unable to assess . Neurologic: no seizure no involuntary movements         Lab Data:   Basic Metabolic Panel: Recent Labs  Lab 07/14/19 0635  NA 138  K 5.9*  CL 102  CO2 24  GLUCOSE 127*  BUN 51*  CREATININE 1.50*  CALCIUM 8.5*    ABG: No results for input(s): PHART, PCO2ART, PO2ART, HCO3, O2SAT in the last 168 hours.  Liver Function Tests: No results for input(s): AST, ALT, ALKPHOS, BILITOT, PROT, ALBUMIN in the last 168 hours. No results for input(s): LIPASE, AMYLASE in the last 168 hours. No results for input(s): AMMONIA in the last 168 hours.  CBC: No results for input(s): WBC, NEUTROABS, HGB, HCT, MCV, PLT in the last 168 hours.  Cardiac Enzymes: No results for input(s): CKTOTAL, CKMB, CKMBINDEX, TROPONINI in the last 168 hours.  BNP (last 3 results) No results for input(s): BNP in the last 8760 hours.  ProBNP (last 3 results) No results for input(s): PROBNP in the last  8760 hours.  Radiological Exams: No results found.  Assessment/Plan Active Problems:   Acute on chronic respiratory failure with hypoxia (HCC)   Acute tubular necrosis (HCC)   Chronic atrial fibrillation with rapid ventricular response (HCC)   Severe sepsis (HCC)   Healthcare-associated pneumonia   Chronic systolic CHF (congestive heart failure), NYHA class 3 (HCC)   1. Acute on chronic respiratory failure hypoxia patient currently is on T collar 28% she is actually doing better able to speak also reportedly. 2. Chronic atrial fibrillation rate controlled we will continue to follow 3. Acute tubular necrosis no change we will continue with supportive care 4. Severe sepsis resolved 5. Healthcare associated pneumonia improving clinically 6. Chronic systolic heart failure appears compensated at this time   I have personally seen and evaluated the patient, evaluated laboratory and imaging results, formulated the assessment and plan and placed orders. The Patient requires high complexity decision making with multiple systems involvement.  Rounds were done with the Respiratory Therapy Director and Staff therapists and discussed with nursing staff also.  Yevonne Pax, MD [facility] Pulmonary Critical Care Medicine Sleep Medicine

## 2019-07-16 LAB — CULTURE, RESPIRATORY W GRAM STAIN: Culture: NORMAL

## 2019-07-16 LAB — TYPE AND SCREEN
ABO/RH(D): A POS
Antibody Screen: NEGATIVE
Unit division: 0

## 2019-07-16 LAB — BPAM RBC
Blood Product Expiration Date: 202102042359
ISSUE DATE / TIME: 202101291253
Unit Type and Rh: 6200

## 2019-10-05 MED ORDER — HYDROCODONE-ACETAMINOPHEN 5-325 MG PO TABS
1.00 | ORAL_TABLET | ORAL | Status: DC
Start: ? — End: 2019-10-05

## 2019-10-05 MED ORDER — GENERIC EXTERNAL MEDICATION
Status: DC
Start: ? — End: 2019-10-05

## 2019-10-05 MED ORDER — SERTRALINE HCL 25 MG PO TABS
50.00 | ORAL_TABLET | ORAL | Status: DC
Start: 2019-10-10 — End: 2019-10-05

## 2019-10-05 MED ORDER — BUDESONIDE 0.5 MG/2ML IN SUSP
0.50 | RESPIRATORY_TRACT | Status: DC
Start: 2019-10-09 — End: 2019-10-05

## 2019-10-05 MED ORDER — GABAPENTIN 300 MG PO CAPS
300.00 | ORAL_CAPSULE | ORAL | Status: DC
Start: 2019-10-09 — End: 2019-10-05

## 2019-10-05 MED ORDER — LABETALOL HCL 5 MG/ML IV SOLN
20.00 | INTRAVENOUS | Status: DC
Start: ? — End: 2019-10-05

## 2019-10-05 MED ORDER — INSULIN LISPRO 100 UNIT/ML ~~LOC~~ SOLN
1.00 | SUBCUTANEOUS | Status: DC
Start: ? — End: 2019-10-05

## 2019-10-05 MED ORDER — MONTELUKAST SODIUM 10 MG PO TABS
10.00 | ORAL_TABLET | ORAL | Status: DC
Start: 2019-10-10 — End: 2019-10-05

## 2019-10-05 MED ORDER — FERROUS SULFATE 300 (60 FE) MG/5ML PO SYRP
300.00 | ORAL_SOLUTION | ORAL | Status: DC
Start: 2019-10-10 — End: 2019-10-05

## 2019-10-05 MED ORDER — ARFORMOTEROL TARTRATE 15 MCG/2ML IN NEBU
15.00 | INHALATION_SOLUTION | RESPIRATORY_TRACT | Status: DC
Start: 2019-10-09 — End: 2019-10-05

## 2019-10-05 MED ORDER — AMIODARONE HCL 200 MG PO TABS
200.00 | ORAL_TABLET | ORAL | Status: DC
Start: 2019-10-10 — End: 2019-10-05

## 2019-10-05 MED ORDER — SODIUM CHLORIDE 0.9 % IV SOLN
10.00 | INTRAVENOUS | Status: DC
Start: ? — End: 2019-10-05

## 2019-10-05 MED ORDER — CARVEDILOL 6.25 MG PO TABS
3.13 | ORAL_TABLET | ORAL | Status: DC
Start: 2019-10-06 — End: 2019-10-05

## 2019-10-05 MED ORDER — LACOSAMIDE 10 MG/ML PO SOLN
100.00 | ORAL | Status: DC
Start: 2019-10-09 — End: 2019-10-05

## 2019-10-05 MED ORDER — GENERIC EXTERNAL MEDICATION
2.50 | Status: DC
Start: 2019-10-09 — End: 2019-10-05

## 2019-10-05 MED ORDER — ASPIRIN 81 MG PO CHEW
324.00 | CHEWABLE_TABLET | ORAL | Status: DC
Start: 2019-10-10 — End: 2019-10-05

## 2019-10-05 MED ORDER — LINEZOLID 600 MG/300ML IV SOLN
600.00 | INTRAVENOUS | Status: DC
Start: 2019-10-09 — End: 2019-10-05

## 2019-10-05 MED ORDER — ENOXAPARIN SODIUM 40 MG/0.4ML ~~LOC~~ SOLN
40.00 | SUBCUTANEOUS | Status: DC
Start: 2019-10-10 — End: 2019-10-05

## 2019-10-05 MED ORDER — INSULIN GLARGINE 100 UNIT/ML ~~LOC~~ SOLN
1.00 | SUBCUTANEOUS | Status: DC
Start: 2019-10-09 — End: 2019-10-05

## 2019-10-05 MED ORDER — MIRTAZAPINE 15 MG PO TABS
15.00 | ORAL_TABLET | ORAL | Status: DC
Start: 2019-10-09 — End: 2019-10-05

## 2019-10-05 MED ORDER — DILTIAZEM HCL 30 MG PO TABS
30.00 | ORAL_TABLET | ORAL | Status: DC
Start: 2019-10-09 — End: 2019-10-05

## 2019-10-05 MED ORDER — ACCU-PRO PUMP SET/VENT MISC
2.50 | Status: DC
Start: ? — End: 2019-10-05

## 2019-10-05 MED ORDER — INSULIN LISPRO 100 UNIT/ML ~~LOC~~ SOLN
1.00 | SUBCUTANEOUS | Status: DC
Start: 2019-10-09 — End: 2019-10-05

## 2019-10-08 MED ORDER — GENERIC EXTERNAL MEDICATION
Status: DC
Start: ? — End: 2019-10-08

## 2019-10-08 MED ORDER — CARVEDILOL 6.25 MG PO TABS
6.25 | ORAL_TABLET | ORAL | Status: DC
Start: 2019-10-09 — End: 2019-10-08

## 2019-10-08 MED ORDER — SALINE NASAL SPRAY 0.65 % NA SOLN
2.00 | NASAL | Status: DC
Start: 2019-10-09 — End: 2019-10-08

## 2019-10-09 MED ORDER — GENERIC EXTERNAL MEDICATION
Status: DC
Start: ? — End: 2019-10-09

## 2019-10-17 MED ORDER — GENERIC EXTERNAL MEDICATION
Status: DC
Start: ? — End: 2019-10-17

## 2019-10-17 MED ORDER — HYDROCODONE-ACETAMINOPHEN 5-325 MG PO TABS
1.00 | ORAL_TABLET | ORAL | Status: DC
Start: ? — End: 2019-10-17

## 2019-10-17 MED ORDER — ACETAMINOPHEN 325 MG PO TABS
650.00 | ORAL_TABLET | ORAL | Status: DC
Start: ? — End: 2019-10-17

## 2019-10-17 MED ORDER — FUROSEMIDE 10 MG/ML PO SOLN
40.00 | ORAL | Status: DC
Start: 2019-10-18 — End: 2019-10-17

## 2019-10-17 MED ORDER — MODAFINIL 200 MG PO TABS
200.00 | ORAL_TABLET | ORAL | Status: DC
Start: 2019-10-18 — End: 2019-10-17

## 2019-10-17 MED ORDER — ARFORMOTEROL TARTRATE 15 MCG/2ML IN NEBU
15.00 | INHALATION_SOLUTION | RESPIRATORY_TRACT | Status: DC
Start: 2019-10-17 — End: 2019-10-17

## 2019-10-17 MED ORDER — FERROUS SULFATE 300 (60 FE) MG/5ML PO SYRP
300.00 | ORAL_SOLUTION | ORAL | Status: DC
Start: 2019-10-18 — End: 2019-10-17

## 2019-10-17 MED ORDER — GABAPENTIN 300 MG PO CAPS
300.00 | ORAL_CAPSULE | ORAL | Status: DC
Start: 2019-10-17 — End: 2019-10-17

## 2019-10-17 MED ORDER — MONTELUKAST SODIUM 10 MG PO TABS
10.00 | ORAL_TABLET | ORAL | Status: DC
Start: 2019-10-18 — End: 2019-10-17

## 2019-10-17 MED ORDER — INSULIN LISPRO 100 UNIT/ML ~~LOC~~ SOLN
1.00 | SUBCUTANEOUS | Status: DC
Start: ? — End: 2019-10-17

## 2019-10-17 MED ORDER — SERTRALINE HCL 50 MG PO TABS
50.00 | ORAL_TABLET | ORAL | Status: DC
Start: 2019-10-18 — End: 2019-10-17

## 2019-10-17 MED ORDER — DILTIAZEM HCL 30 MG PO TABS
30.00 | ORAL_TABLET | ORAL | Status: DC
Start: 2019-10-17 — End: 2019-10-17

## 2019-10-17 MED ORDER — METHYLPREDNISOLONE SODIUM SUCC 40 MG IJ SOLR
40.00 | INTRAMUSCULAR | Status: DC
Start: 2019-10-17 — End: 2019-10-17

## 2019-10-17 MED ORDER — INSULIN GLARGINE 100 UNIT/ML ~~LOC~~ SOLN
1.00 | SUBCUTANEOUS | Status: DC
Start: 2019-10-17 — End: 2019-10-17

## 2019-10-17 MED ORDER — SALINE NASAL SPRAY 0.65 % NA SOLN
2.00 | NASAL | Status: DC
Start: ? — End: 2019-10-17

## 2019-10-17 MED ORDER — ENOXAPARIN SODIUM 40 MG/0.4ML ~~LOC~~ SOLN
40.00 | SUBCUTANEOUS | Status: DC
Start: 2019-10-18 — End: 2019-10-17

## 2019-10-17 MED ORDER — INSULIN LISPRO 100 UNIT/ML ~~LOC~~ SOLN
1.00 | SUBCUTANEOUS | Status: DC
Start: 2019-10-17 — End: 2019-10-17

## 2019-10-17 MED ORDER — BUDESONIDE 0.5 MG/2ML IN SUSP
0.50 | RESPIRATORY_TRACT | Status: DC
Start: 2019-10-17 — End: 2019-10-17

## 2019-10-17 MED ORDER — LACOSAMIDE 50 MG PO TABS
50.00 | ORAL_TABLET | ORAL | Status: DC
Start: 2019-10-17 — End: 2019-10-17

## 2019-10-17 MED ORDER — MIRTAZAPINE 15 MG PO TABS
15.00 | ORAL_TABLET | ORAL | Status: DC
Start: 2019-10-17 — End: 2019-10-17

## 2019-10-17 MED ORDER — LABETALOL HCL 5 MG/ML IV SOLN
20.00 | INTRAVENOUS | Status: DC
Start: ? — End: 2019-10-17

## 2019-10-17 MED ORDER — ACETAMINOPHEN 160 MG/5ML PO SUSP
650.00 | ORAL | Status: DC
Start: ? — End: 2019-10-17

## 2019-10-17 MED ORDER — CARVEDILOL 6.25 MG PO TABS
6.25 | ORAL_TABLET | ORAL | Status: DC
Start: 2019-10-17 — End: 2019-10-17

## 2019-10-17 MED ORDER — AMIODARONE HCL 200 MG PO TABS
200.00 | ORAL_TABLET | ORAL | Status: DC
Start: 2019-10-18 — End: 2019-10-17

## 2019-10-17 MED ORDER — MUPIROCIN 2 % EX OINT
TOPICAL_OINTMENT | CUTANEOUS | Status: DC
Start: 2019-10-17 — End: 2019-10-17

## 2019-10-17 MED ORDER — ASPIRIN 81 MG PO CHEW
324.00 | CHEWABLE_TABLET | ORAL | Status: DC
Start: 2019-10-18 — End: 2019-10-17

## 2019-10-17 MED ORDER — ALBUTEROL SULFATE (2.5 MG/3ML) 0.083% IN NEBU
2.50 | INHALATION_SOLUTION | RESPIRATORY_TRACT | Status: DC
Start: ? — End: 2019-10-17

## 2019-10-17 MED ORDER — ACETAMINOPHEN 650 MG RE SUPP
650.00 | RECTAL | Status: DC
Start: ? — End: 2019-10-17

## 2019-10-17 MED ORDER — SODIUM CHLORIDE 7 % IN NEBU
4.00 | INHALATION_SOLUTION | RESPIRATORY_TRACT | Status: DC
Start: 2019-10-17 — End: 2019-10-17

## 2019-11-14 DEATH — deceased

## 2020-08-19 IMAGING — DX DG CHEST 1V PORT
1 series · 1 of 1 positions shown · non-contrast
Comparison: 06/21/2019

CLINICAL DATA: Respiratory failure, tracheostomy

EXAM:
PORTABLE CHEST 1 VIEW

[chest ap]
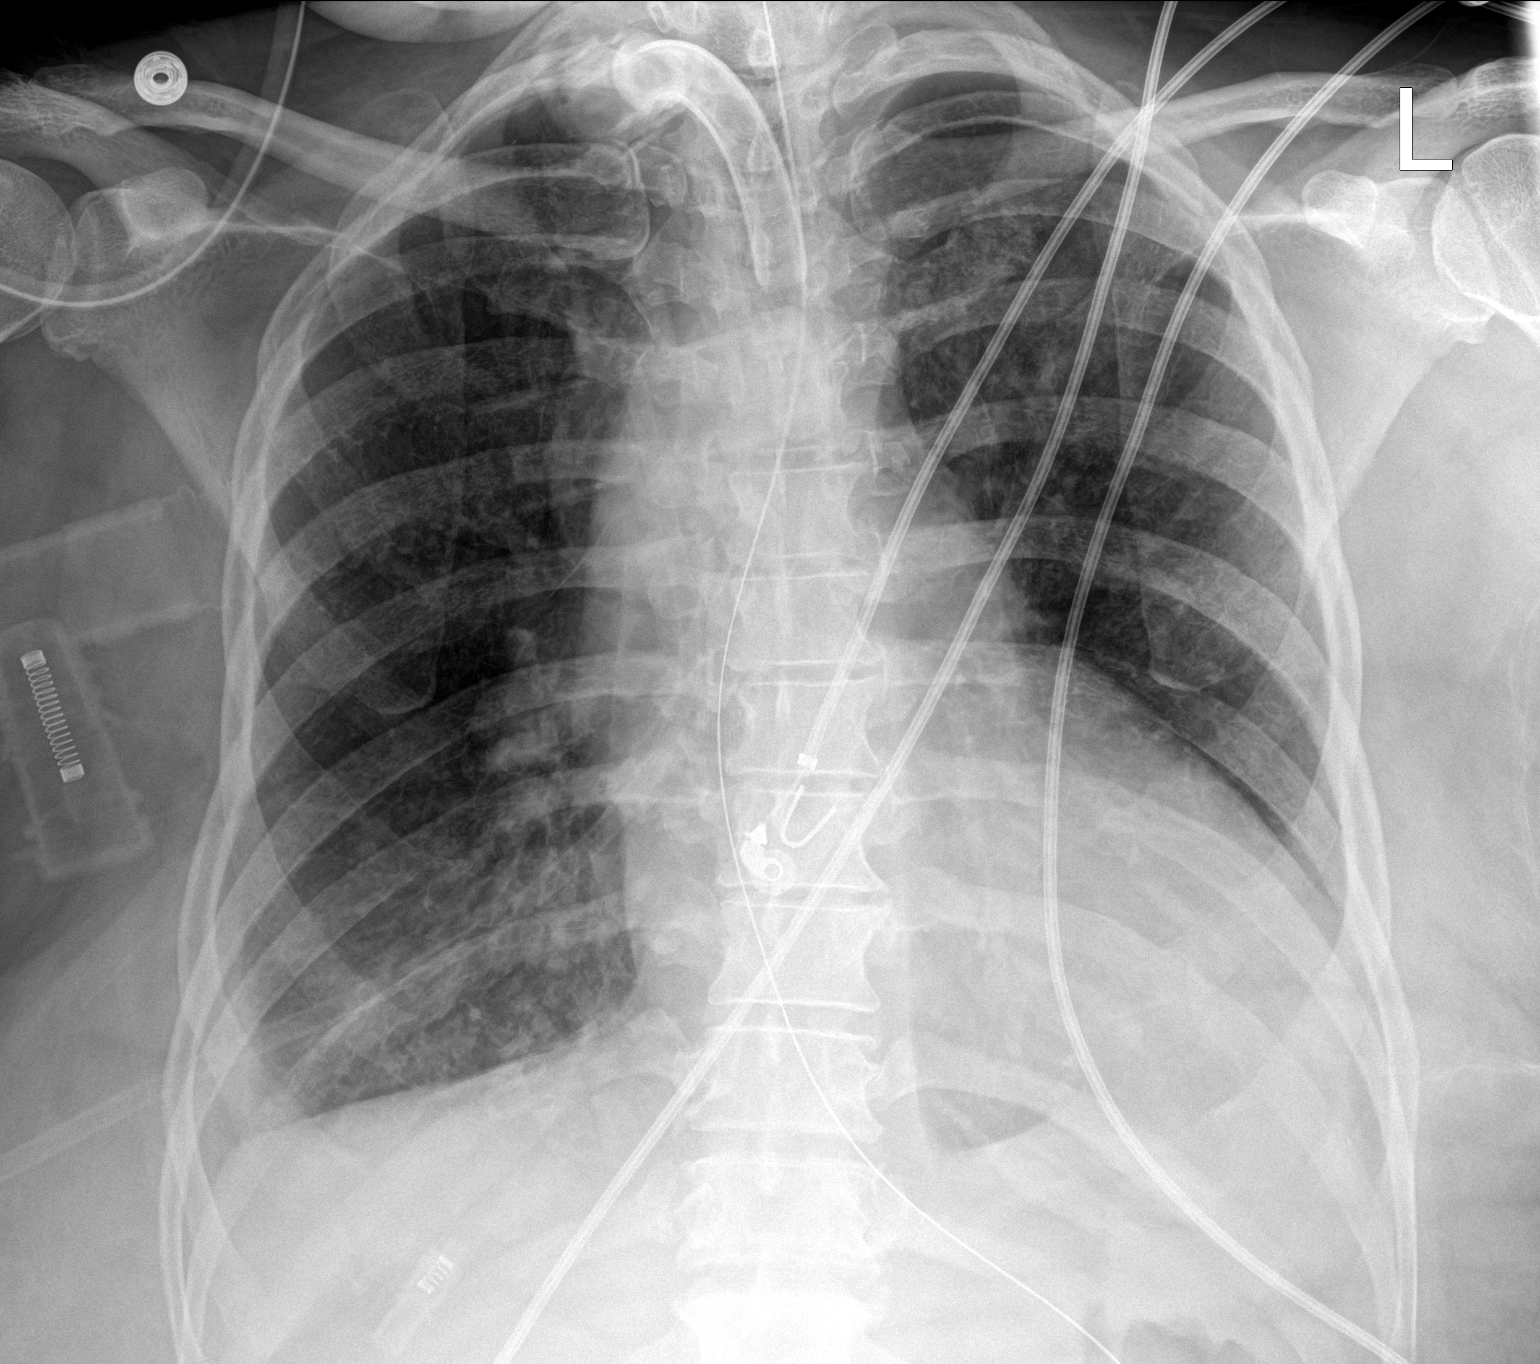

[1 of 1 positions shown; findings below may reference images not displayed]

FINDINGS: Stable cardiomegaly. Significant improvement in bilateral upper lobe
and right lower lobe patchy airspace opacities. There remains
residual left lower lobe collapse/consolidation obscuring the left
hemidiaphragm. No enlarging effusion or pneumothorax. Tracheostomy
and NG tube are stable in position.
IMPRESSION: Improving bilateral airspace process compatible with resolving
alveolar edema/pneumonia.

Residual left lower lobe collapse/consolidation

## 2020-08-22 IMAGING — XA IR PERC PLACEMENT GASTROSTOMY
4 series · 6 of 6 positions shown · non-contrast
Comparison: Abdominal CT - 06/23/2019

INDICATION: Dysphagia. Please place percutaneous gastrostomy tube for enteric
nutrition supplementation purposes.

EXAM:
PULL TROUGH GASTROSTOMY TUBE PLACEMENT

[Series 1: fl (-) angio · 3 of 3 slices shown (1 of 4)]
[im 1/3]
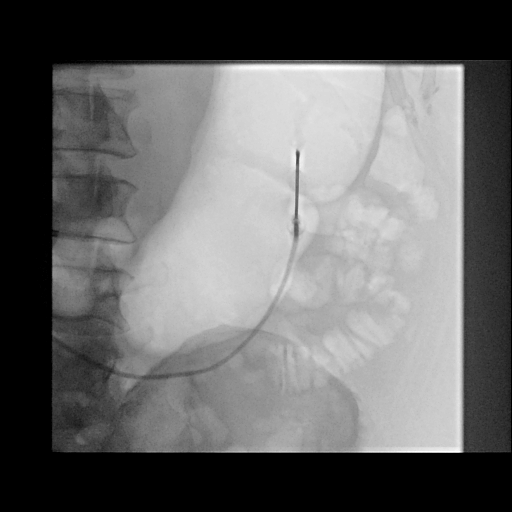
[im 2/3]
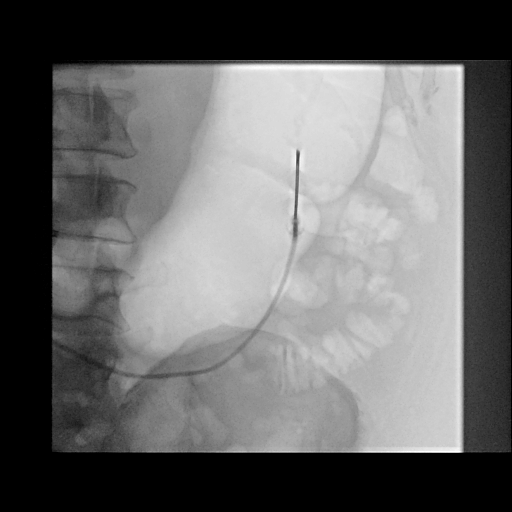
[im 3/3]
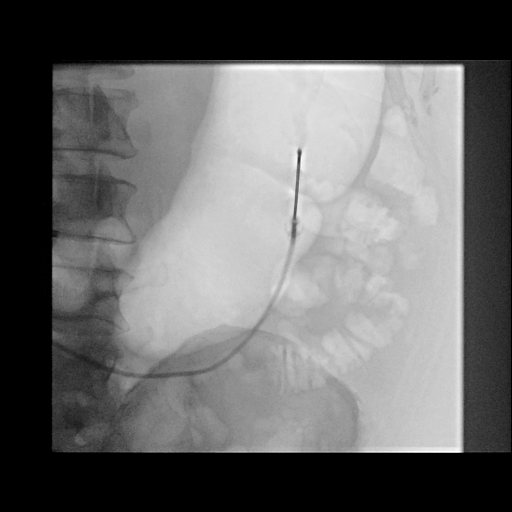

[Series 2: fl (-) angio · 1 of 1 slices shown (2 of 4)]
[im 1/1]
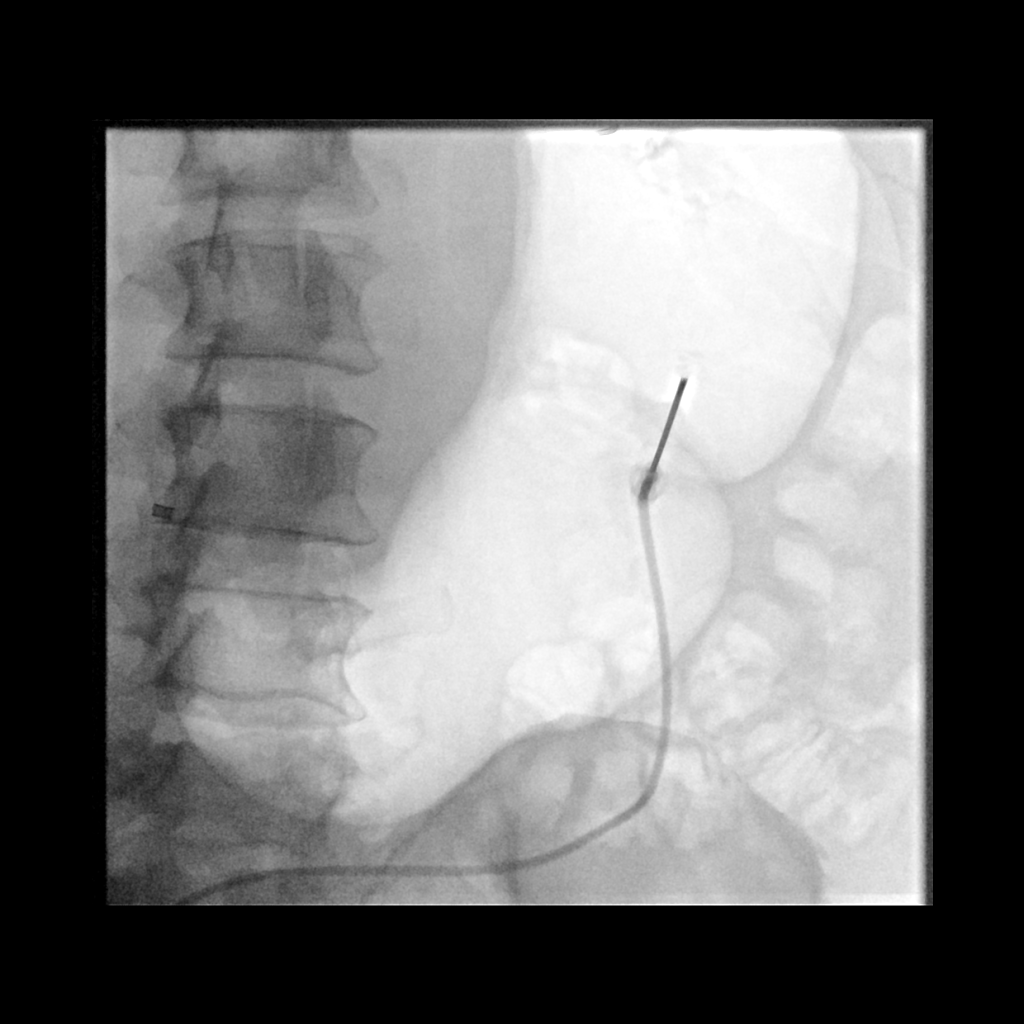

[Series 3: fl (-) angio · 1 of 1 slices shown (3 of 4)]
[im 1/1]
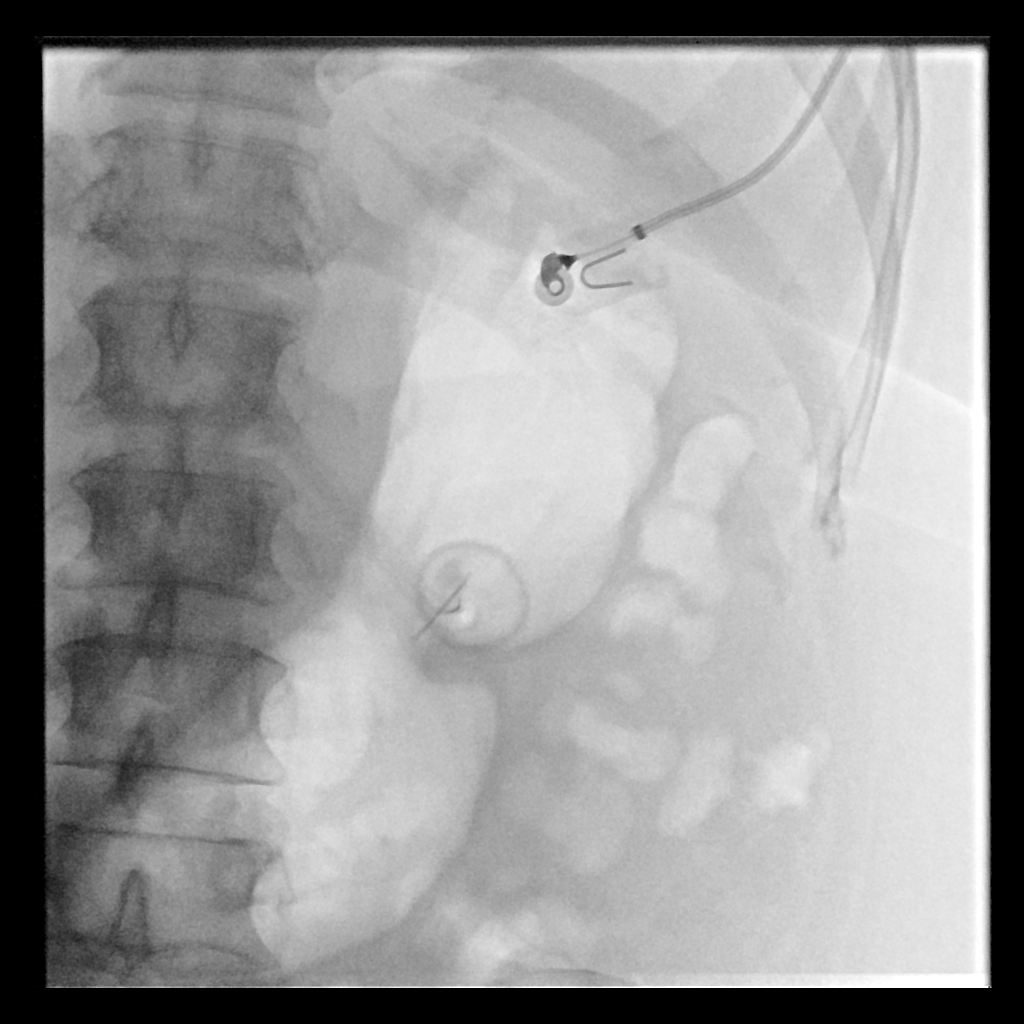

[Series 4: fl (-) angio · 1 of 1 slices shown (4 of 4)]
[im 1/1]
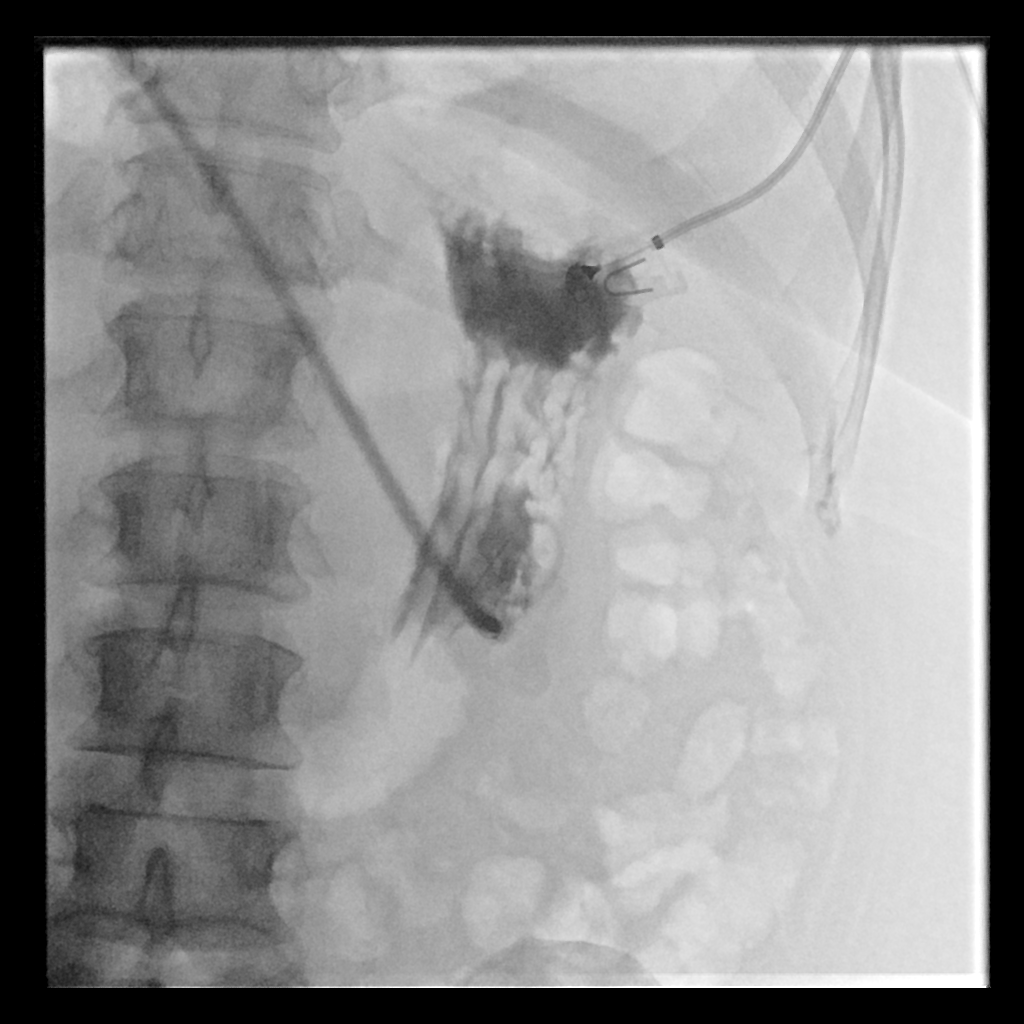

[6 of 6 positions shown; findings below may reference images not displayed]

MEDICATIONS:
Ancef 2 gm IV; Antibiotics were administered within 1 hour of the
procedure.

Glucagon 1 mg IV

CONTRAST:  20 cc mL of Omnipaque 300 administered into the gastric
lumen.

ANESTHESIA/SEDATION:
Moderate (conscious) sedation was employed during this procedure. A
total of Versed 1 mg and Fentanyl 25 mcg was administered
intravenously.

Moderate Sedation Time: 16 minutes. The patient's level of
consciousness and vital signs were monitored continuously by
radiology nursing throughout the procedure under my direct
supervision.

FLUOROSCOPY TIME:  30 seconds (2 mGy)

COMPLICATIONS:
None immediate.

PROCEDURE:
Informed written consent was obtained from the patient's family
following explanation of the procedure, risks, benefits and
alternatives. A time out was performed prior to the initiation of
the procedure. Ultrasound scanning was performed to demarcate the
edge of the left lobe of the liver. Maximal barrier sterile
technique utilized including caps, mask, sterile gowns, sterile
gloves, large sterile drape, hand hygiene and Betadine prep.

The left upper quadrant was sterilely prepped and draped. An oral
gastric catheter was inserted into the stomach under fluoroscopy.
The existing nasogastric feeding tube was removed. The left costal
margin and air opacified transverse colon were identified and
avoided. Air was injected into the stomach for insufflation and
visualization under fluoroscopy. Under sterile conditions a 17 gauge
trocar needle was utilized to access the stomach percutaneously
beneath the left subcostal margin after the overlying soft tissues
were anesthetized with 1% Lidocaine with epinephrine. Needle
position was confirmed within the stomach with aspiration of air and
injection of small amount of contrast. A single T tack was deployed
for gastropexy. Over an Amplatz guide wire, a 9-French sheath was
inserted into the stomach. A snare device was utilized to capture
the oral gastric catheter. The snare device was pulled retrograde
from the stomach up the esophagus and out the oropharynx. The
20-French pull-through gastrostomy was connected to the snare device
and pulled antegrade through the oropharynx down the esophagus into
the stomach and then through the percutaneous tract external to the
patient. The gastrostomy was assembled externally. Contrast
injection confirms position in the stomach. Several spot
radiographic images were obtained in various obliquities for
documentation. The patient tolerated procedure well without
immediate post procedural complication.
FINDINGS: After successful fluoroscopic guided placement, the gastrostomy tube
is appropriately positioned with internal disc against the ventral
aspect of the gastric lumen.
IMPRESSION: Successful fluoroscopic insertion of a 20-French pull-through
gastrostomy tube.

The gastrostomy may be used immediately for medication
administration and in 24 hrs for the initiation of feeds.

## 2020-08-28 IMAGING — DX DG CHEST 1V PORT
1 series · 1 of 1 positions shown · non-contrast
Comparison: 06/24/2019 and CT abdomen 06/23/2019.

CLINICAL DATA: Respiratory failure, tracheostomy.

EXAM:
PORTABLE CHEST 1 VIEW

[chest]
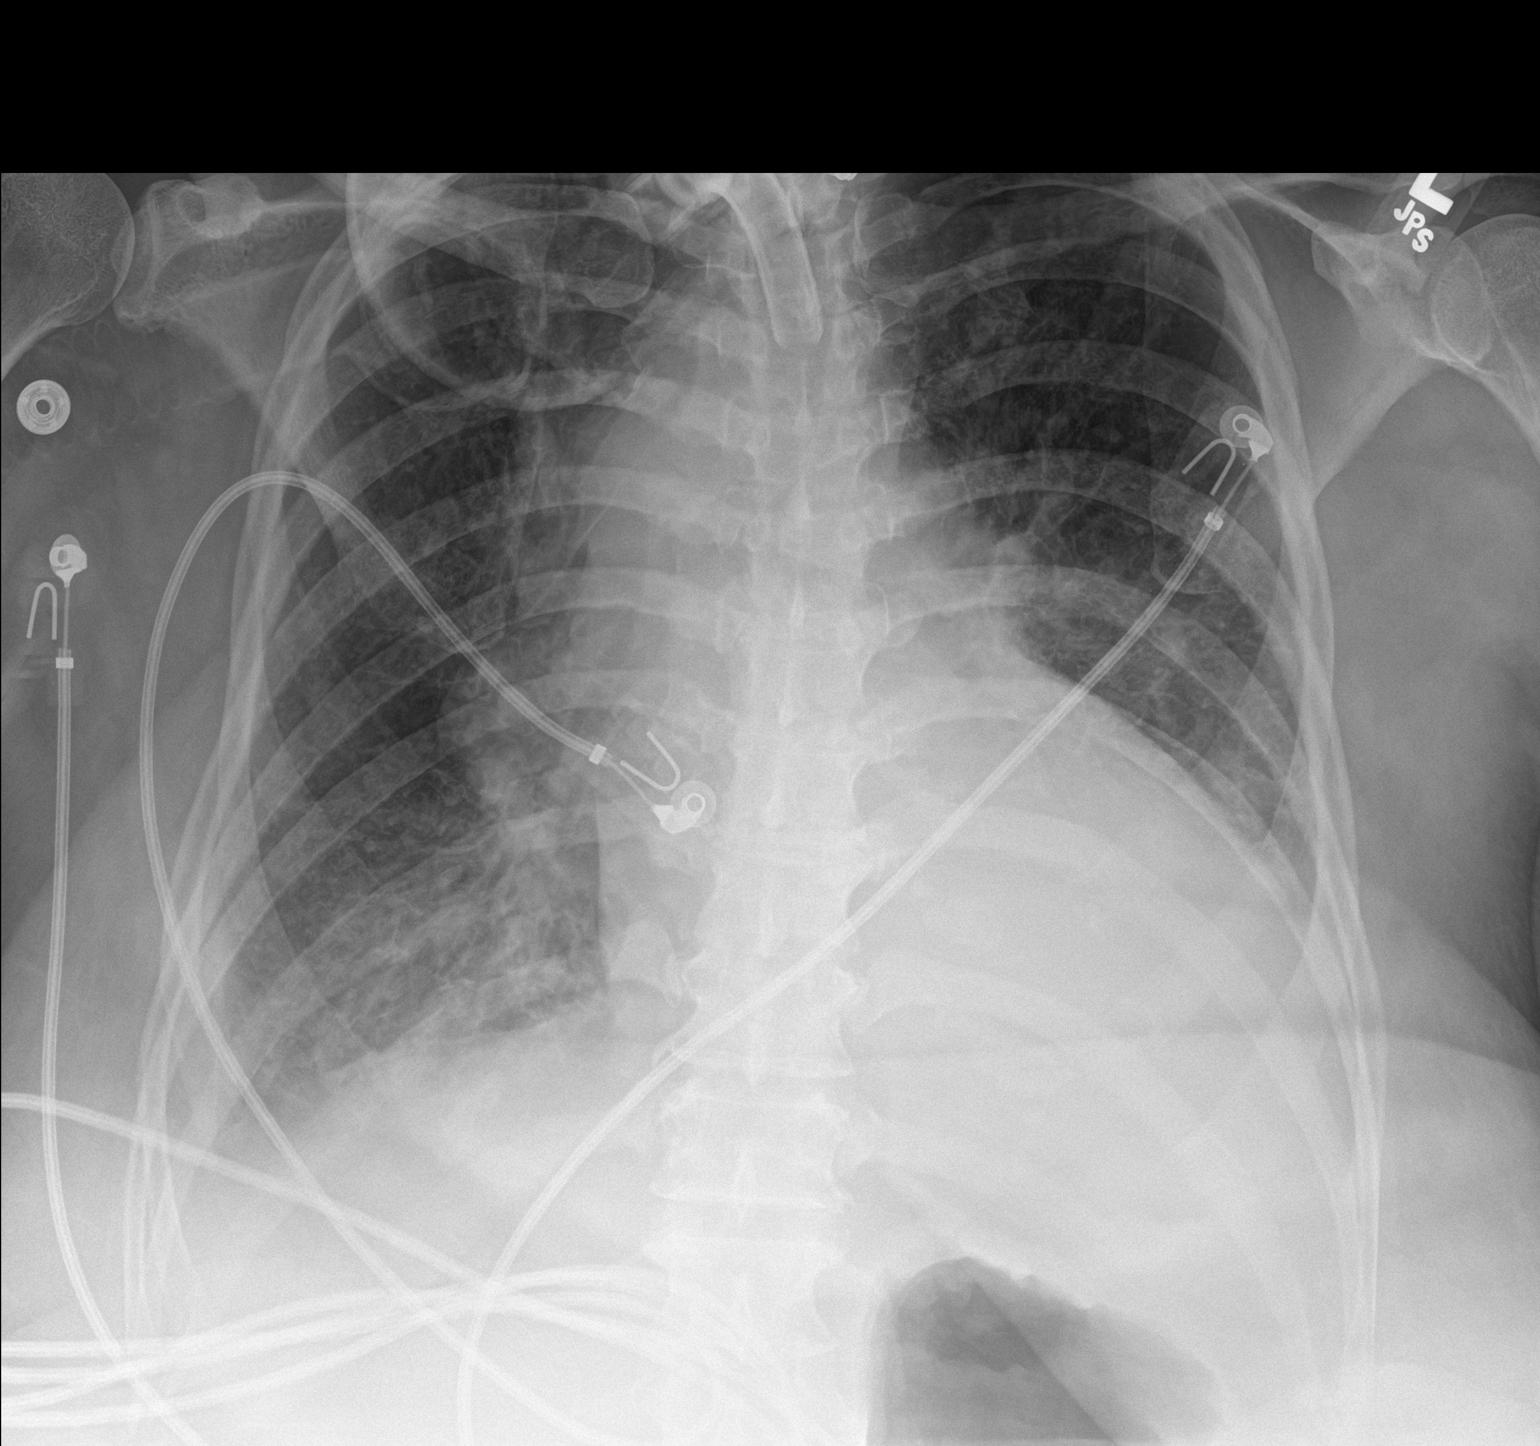

[1 of 1 positions shown; findings below may reference images not displayed]

FINDINGS: Tracheostomy is midline. Nasogastric tube has been removed. Heart is
at the upper limits of normal in size to mildly enlarged, stable.
Mild mixed interstitial and airspace opacification, basilar
dependent and slightly increased from 06/24/2019. Folds of adipose
tissue overlie the lower left hemithorax, simulating retrocardiac
airspace opacification. Probable small bilateral pleural effusions.
IMPRESSION: Bibasilar dependent mixed interstitial and airspace opacification
with small bilateral pleural effusions, findings which can be seen
with congestive heart failure. Pneumonia is not excluded.
